# Patient Record
Sex: Female | Born: 1960 | Race: Asian | Hispanic: No | Marital: Married | Smoking: Never smoker
Health system: Southern US, Community
[De-identification: ages and names within clinical notes are randomized; demographics above are authoritative.]

## PROBLEM LIST (undated history)

## (undated) DIAGNOSIS — R519 Headache, unspecified: Secondary | ICD-10-CM

## (undated) DIAGNOSIS — F32A Depression, unspecified: Secondary | ICD-10-CM

## (undated) DIAGNOSIS — R51 Headache: Secondary | ICD-10-CM

## (undated) DIAGNOSIS — F329 Major depressive disorder, single episode, unspecified: Secondary | ICD-10-CM

## (undated) DIAGNOSIS — F419 Anxiety disorder, unspecified: Secondary | ICD-10-CM

## (undated) DIAGNOSIS — I10 Essential (primary) hypertension: Secondary | ICD-10-CM

## (undated) HISTORY — DX: Depression, unspecified: F32.A

## (undated) HISTORY — PX: TUMOR REMOVAL: SHX12

## (undated) HISTORY — DX: Major depressive disorder, single episode, unspecified: F32.9

## (undated) HISTORY — PX: LASIK: SHX215

## (undated) HISTORY — DX: Headache, unspecified: R51.9

## (undated) HISTORY — DX: Headache: R51

## (undated) HISTORY — DX: Anxiety disorder, unspecified: F41.9

## (undated) HISTORY — PX: HAND SURGERY: SHX662

## (undated) HISTORY — PX: WISDOM TOOTH EXTRACTION: SHX21

---

## 2010-02-17 ENCOUNTER — Ambulatory Visit: Payer: Self-pay | Admitting: Family Medicine

## 2010-02-17 ENCOUNTER — Inpatient Hospital Stay (HOSPITAL_COMMUNITY): Admission: EM | Admit: 2010-02-17 | Discharge: 2010-02-18 | Payer: Self-pay | Admitting: Family Medicine

## 2010-02-18 ENCOUNTER — Encounter: Payer: Self-pay | Admitting: Family Medicine

## 2010-02-27 ENCOUNTER — Telehealth: Payer: Self-pay | Admitting: Family Medicine

## 2011-01-28 NOTE — Progress Notes (Signed)
  Phone Note Outgoing Call   Call placed by: Aloys Hupfer MD,  February 27, 2010 5:46 PM Summary of Call: Pt unable to keep hosp f/u appt today because she did not have transportation.  Pt not taking Celexa because it caused vomiting and diarrhea. Pt currently in [place].  She was given incorrect number for [facility] so she could not call to cancel appt.  She was given number as [phone].  Initial call taken by: Rayhana Slider MD,  February 27, 2010 5:52 PM

## 2011-01-28 NOTE — Miscellaneous (Signed)
Summary: Rx for Celexa  Clinical Lists Changes  Medications: Added new medication of CELEXA 20 MG TABS (CITALOPRAM HYDROBROMIDE) 1 tab by mouth every morning for depression - Signed Rx of CELEXA 20 MG TABS (CITALOPRAM HYDROBROMIDE) 1 tab by mouth every morning for depression;  #30 x 3;  Signed;  Entered by: Angeline Slim MD;  Authorized by: Angeline Slim MD;  Method used: Electronically to [facility] # [phone]*, [address] [place], [place], [place]  [address], Ph: [phone], Fax: [phone]    Prescriptions: CELEXA 20 MG TABS (CITALOPRAM HYDROBROMIDE) 1 tab by mouth every morning for depression  #30 x 3   Entered and Authorized by:   Angeline Slim MD   Signed by:   Angeline Slim MD on 02/18/2010   Method used:   Electronically to        [facility] # [phone]* (retail)       [address] [place]  [address]       Ph: [phone]       Fax: [phone]   RxID:   [id number]

## 2011-03-21 LAB — BASIC METABOLIC PANEL
BUN: 5 mg/dL — ABNORMAL LOW (ref 6–23)
CO2: 26 mEq/L (ref 19–32)
Chloride: 102 mEq/L (ref 96–112)
Chloride: 104 mEq/L (ref 96–112)
GFR calc Af Amer: >60 mL/min (ref >60)
GFR calc non Af Amer: >60 mL/min (ref >60)
Glucose, Bld: 119 mg/dL — ABNORMAL HIGH (ref 70–99)
Potassium: 3.6 mEq/L (ref 3.5–5.1)
Potassium: 3.9 mEq/L (ref 3.5–5.1)
Sodium: 133 mEq/L — ABNORMAL LOW (ref 135–145)

## 2011-03-21 LAB — CBC
HCT: 38 % (ref 36.0–46.0)
MCHC: 34.5 g/dL (ref 30.0–36.0)
MCV: 93.4 fL (ref 78.0–100.0)
RBC: 4.07 MIL/uL (ref 3.87–5.11)
WBC: 17.5 10*3/uL — ABNORMAL HIGH (ref 4.0–10.5)

## 2011-03-21 LAB — DIFFERENTIAL
Basophils Relative: 0 % (ref 0–1)
Eosinophils Absolute: 0.2 10*3/uL (ref 0.0–0.7)
Eosinophils Relative: 1 % (ref 0–5)
Lymphs Abs: 2.2 10*3/uL (ref 0.7–4.0)
Monocytes Relative: 6 % (ref 3–12)

## 2014-01-31 ENCOUNTER — Encounter: Payer: Self-pay | Admitting: Emergency Medicine

## 2014-01-31 ENCOUNTER — Ambulatory Visit: Payer: No Typology Code available for payment source | Admitting: Emergency Medicine

## 2014-01-31 VITALS — BP 98/68 | HR 88 | Temp 98.0°F | Resp 16 | Ht 67.0 in | Wt 114.0 lb

## 2014-01-31 DIAGNOSIS — N390 Urinary tract infection, site not specified: Secondary | ICD-10-CM

## 2014-01-31 DIAGNOSIS — R300 Dysuria: Secondary | ICD-10-CM

## 2014-01-31 LAB — POCT URINALYSIS DIPSTICK
BILIRUBIN UA: NEGATIVE
GLUCOSE UA: NEGATIVE
Ketones, UA: NEGATIVE
Nitrite, UA: NEGATIVE
Protein, UA: NEGATIVE
RBC UA: NEGATIVE
SPEC GRAV UA: 1.01
Urobilinogen, UA: 0.2
pH, UA: 7

## 2014-01-31 LAB — POCT UA - MICROSCOPIC ONLY
Casts, Ur, LPF, POC: NEGATIVE
Crystals, Ur, HPF, POC: NEGATIVE
MUCUS UA: NEGATIVE
Yeast, UA: NEGATIVE

## 2014-01-31 MED ORDER — NITROFURANTOIN MONOHYD MACRO 100 MG PO CAPS: 100.0000 mg | ORAL_CAPSULE | Freq: Two times a day (BID) | ORAL | Status: DC

## 2014-01-31 NOTE — Patient Instructions (Signed)
Urinary Tract Infection  Urinary tract infections (UTIs) can develop anywhere along your urinary tract. Your urinary tract is your body's drainage system for removing wastes and extra water. Your urinary tract includes two kidneys, two ureters, a bladder, and a urethra. Your kidneys are a pair of bean-shaped organs. Each kidney is about the size of your fist. They are located below your ribs, one on each side of your spine.  CAUSES  Infections are caused by microbes, which are microscopic organisms, including fungi, viruses, and bacteria. These organisms are so small that they can only be seen through a microscope. Bacteria are the microbes that most commonly cause UTIs.  SYMPTOMS   Symptoms of UTIs may vary by age and gender of the patient and by the location of the infection. Symptoms in young women typically include a frequent and intense urge to urinate and a painful, burning feeling in the bladder or urethra during urination. Older women and men are more likely to be tired, shaky, and weak and have muscle aches and abdominal pain. A fever may mean the infection is in your kidneys. Other symptoms of a kidney infection include pain in your back or sides below the ribs, nausea, and vomiting.  DIAGNOSIS  To diagnose a UTI, your caregiver will ask you about your symptoms. Your caregiver also will ask to provide a urine sample. The urine sample will be tested for bacteria and white blood cells. White blood cells are made by your body to help fight infection.  TREATMENT   Typically, UTIs can be treated with medication. Because most UTIs are caused by a bacterial infection, they usually can be treated with the use of antibiotics. The choice of antibiotic and length of treatment depend on your symptoms and the type of bacteria causing your infection.  HOME CARE INSTRUCTIONS   If you were prescribed antibiotics, take them exactly as your caregiver instructs you. Finish the medication even if you feel better after you  have only taken some of the medication.   Drink enough water and fluids to keep your urine clear or pale yellow.   Avoid caffeine, tea, and carbonated beverages. They tend to irritate your bladder.   Empty your bladder often. Avoid holding urine for long periods of time.   Empty your bladder before and after sexual intercourse.   After a bowel movement, women should cleanse from front to back. Use each tissue only once.  SEEK MEDICAL CARE IF:    You have back pain.   You develop a fever.   Your symptoms do not begin to resolve within 3 days.  SEEK IMMEDIATE MEDICAL CARE IF:    You have severe back pain or lower abdominal pain.   You develop chills.   You have nausea or vomiting.   You have continued burning or discomfort with urination.  MAKE SURE YOU:    Understand these instructions.   Will watch your condition.   Will get help right away if you are not doing well or get worse.  Document Released: 09/24/2005 Document Revised: 06/15/2012 Document Reviewed: 01/23/2012  Exit[employer] Patient Information 2014 [employer].

## 2014-01-31 NOTE — Progress Notes (Signed)
   Subjective:    Patient ID: Glenda Pham, female    DOB: 11-Feb-1961, 53 y.o.   MRN: [id number]  HPI 53 yo female with dysuria, hesitancy, frequency and urgency for past week.  Gradually worsening.  History of UTI with similar symptoms in past, unsure of medications given to help alleviate symptoms. No radiation of pain.   PPMH:  Negative for diabetes or hypertension  SH:  Nonsmoker, no alcohol  FH:  noncontributory  Review of Systems  Constitutional: Negative for fever and chills.  Gastrointestinal: Negative for nausea, vomiting, abdominal pain, diarrhea and constipation.  Genitourinary: Positive for urgency and frequency. Negative for hematuria, flank pain and decreased urine volume.  Musculoskeletal: Negative for back pain.       Objective:   Physical Exam Blood pressure 98/68, pulse 88, temperature 98 F (36.7 C), temperature source Oral, resp. rate 16, height 5\' 7"  (1.702 m), weight 114 lb (51.71 kg), SpO2 98.00%. Body mass index is 17.85 kg/(m^2). Well-developed, well nourished female  who is awake, alert and oriented, in NAD. HEENT: [place]/AT, PERRL, EOMI.  Sclera and conjunctiva are clear.  Neck: supple, non-tender, no lymphadenopathy, thyromegaly. Heart: RRR, no murmur Lungs: normal effort, CTA Abdomen: normo-active bowel sounds, supple, non-tender, no mass or organomegaly. Back:  No CVA tenderness Extremities: no cyanosis, clubbing or edema. Skin: warm and dry without rash. Psychologic: good mood and appropriate affect, normal speech and behavior.     Assessment & Plan:   Results for orders placed in visit on 01/31/14  POCT UA - MICROSCOPIC ONLY      Result Value Range   WBC, Ur, HPF, POC 2-6     RBC, urine, microscopic 0-3     Bacteria, U Microscopic 2+     Mucus, UA negative     Epithelial cells, urine per micros 1-2     Crystals, Ur, HPF, POC negative     Casts, Ur, LPF, POC negative     Yeast, UA negative    POCT URINALYSIS DIPSTICK      Result Value  Range   Color, UA yallow     Clarity, UA clear     Glucose, UA negative     Bilirubin, UA negative     Ketones, UA negative     Spec Grav, UA 1.010     Blood, UA negative     pH, UA 7.0     Protein, UA negative     Urobilinogen, UA 0.2     Nitrite, UA negative     Leukocytes, UA small (1+)    Diagnosis UTI, will start on Macrobid x 5 days.

## 2014-04-25 ENCOUNTER — Ambulatory Visit (INDEPENDENT_AMBULATORY_CARE_PROVIDER_SITE_OTHER): Payer: No Typology Code available for payment source | Admitting: Emergency Medicine

## 2014-04-25 VITALS — BP 120/78 | HR 93 | Temp 97.5°F | Resp 20 | Ht <= 58 in | Wt 117.6 lb

## 2014-04-25 DIAGNOSIS — M130 Polyarthritis, unspecified: Secondary | ICD-10-CM

## 2014-04-25 LAB — POCT CBC
Granulocyte percent: 64.4 %G (ref 37–80)
HEMATOCRIT: 39.6 % (ref 37.7–47.9)
Hemoglobin: 12.7 g/dL (ref 12.2–16.2)
LYMPH, POC: 2.1 (ref 0.6–3.4)
MCH: 30.2 pg (ref 27–31.2)
MCHC: 32.1 g/dL (ref 31.8–35.4)
MCV: 94.2 fL (ref 80–97)
MID (cbc): 0.5 (ref 0–0.9)
MPV: 10.6 fL (ref 0–99.8)
POC GRANULOCYTE: 4.7 (ref 2–6.9)
POC LYMPH %: 29.3 % (ref 10–50)
POC MID %: 6.3 % (ref 0–12)
Platelet Count, POC: 230 10*3/uL (ref 142–424)
RBC: 4.2 M/uL (ref 4.04–5.48)
RDW, POC: 12.6 %
WBC: 7.3 10*3/uL (ref 4.6–10.2)

## 2014-04-25 LAB — COMPREHENSIVE METABOLIC PANEL
ALT: 12 U/L (ref 0–35)
AST: 12 U/L (ref 0–37)
Albumin: 4.1 g/dL (ref 3.5–5.2)
Alkaline Phosphatase: 51 U/L (ref 39–117)
BILIRUBIN TOTAL: 0.7 mg/dL (ref 0.2–1.2)
BUN: 11 mg/dL (ref 6–23)
CALCIUM: 9 mg/dL (ref 8.4–10.5)
CHLORIDE: 103 meq/L (ref 96–112)
CO2: 29 meq/L (ref 19–32)
CREATININE: 0.68 mg/dL (ref 0.50–1.10)
Glucose, Bld: 98 mg/dL (ref 70–99)
Potassium: 3.9 mEq/L (ref 3.5–5.3)
Sodium: 137 mEq/L (ref 135–145)
Total Protein: 6.8 g/dL (ref 6.0–8.3)

## 2014-04-25 LAB — RHEUMATOID FACTOR

## 2014-04-25 LAB — POCT SEDIMENTATION RATE: POCT SED RATE: 12 mm/h (ref 0–22)

## 2014-04-25 LAB — URIC ACID: URIC ACID, SERUM: 4.7 mg/dL (ref 2.4–7.0)

## 2014-04-25 LAB — C-REACTIVE PROTEIN

## 2014-04-25 MED ORDER — MELOXICAM 15 MG PO TABS: 15.0000 mg | ORAL_TABLET | Freq: Every day | ORAL | Status: DC

## 2014-04-25 NOTE — Progress Notes (Signed)
[facility] [address], [place] [address] [phone]  Date:  04/25/2014   Name:  Ladonne Sharples   DOB:  05/19/1961   MRN:  [id number]  PCP:  No PCP Per Patient    Chief Complaint: full body pain   History of Present Illness:  Mariaceleste Herrera is a 53 y.o. very pleasant female patient who presents with the following:  Has a month long duration of pain in joints that is transient and migratory.  Involves mainly the larger joints.  Not associated with tick exposure, fever, chills, jaundice.  No injury or overuse.  No improvement with over the counter medications or other home remedies.  Born in [place].  Denies other complaint or health concern today.   There are no active problems to display for this patient.   Past Medical History  Diagnosis Date  . Depression     History reviewed. No pertinent past surgical history.  History  Substance Use Topics  . Smoking status: Never Smoker   . Smokeless tobacco: Never Used  . Alcohol Use: No    History reviewed. No pertinent family history.  No Known Allergies  Medication list has been reviewed and updated.  Current Outpatient Prescriptions on File Prior to Visit  Medication Sig Dispense Refill  . nitrofurantoin, macrocrystal-monohydrate, (MACROBID) 100 MG capsule Take 1 capsule (100 mg total) by mouth 2 (two) times daily.  14 capsule  0   No current facility-administered medications on file prior to visit.    Review of Systems:  As per HPI, otherwise negative.    Physical Examination: Filed Vitals:   04/25/14 1517  BP: 120/78  Pulse: 93  Temp: 97.5 F (36.4 C)  Resp: 20   Filed Vitals:   04/25/14 1517  Height: 4' 8.5" (1.435 m)  Weight: 117 lb 9.6 oz (53.343 kg)   Body mass index is 25.9 kg/(m^2). Ideal Body Weight: Weight in (lb) to have BMI = 25: 113.3   GEN: WDWN, NAD, Non-toxic, Alert & Oriented x 3 HEENT: Atraumatic, Normocephalic.  Ears and Nose: No external deformity. EXTR:  No clubbing/cyanosis/edema NEURO: Normal gait.  PSYCH: Normally interactive. Conversant. Not depressed or anxious appearing.  Calm demeanor.  No rash or icterus No joint redness or swelling.    Assessment and Plan: Polyarthritis Labs mobic Follow up with Dr Marin Comment   Signed,  Ellison Carwin, MD

## 2014-04-26 LAB — ANA: Anti Nuclear Antibody(ANA): NEGATIVE

## 2014-05-03 NOTE — Progress Notes (Signed)
Attempted to call, spoke to someone in the home, they did not understand me and disconnected call/ unable to schedule

## 2014-08-13 ENCOUNTER — Ambulatory Visit (INDEPENDENT_AMBULATORY_CARE_PROVIDER_SITE_OTHER): Payer: No Typology Code available for payment source | Admitting: Family Medicine

## 2014-08-13 ENCOUNTER — Ambulatory Visit (INDEPENDENT_AMBULATORY_CARE_PROVIDER_SITE_OTHER): Payer: No Typology Code available for payment source

## 2014-08-13 VITALS — BP 102/72 | HR 97 | Temp 98.1°F | Resp 18 | Ht <= 58 in | Wt 115.8 lb

## 2014-08-13 DIAGNOSIS — M67431 Ganglion, right wrist: Secondary | ICD-10-CM

## 2014-08-13 DIAGNOSIS — M25539 Pain in unspecified wrist: Secondary | ICD-10-CM

## 2014-08-13 DIAGNOSIS — M25531 Pain in right wrist: Secondary | ICD-10-CM

## 2014-08-13 DIAGNOSIS — M6740 Ganglion, unspecified site: Secondary | ICD-10-CM

## 2014-08-13 NOTE — Progress Notes (Signed)
   Subjective:    Patient ID: Glenda Pham, female    DOB: Jan 27, 1961, 53 y.o.   MRN: [id number] This chart was scribed for Glenda Haber, MD by Steva Colder, ED Scribe. The patient was seen in room 12 at 1:19 PM.   Chief Complaint  Patient presents with  . 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 female who presents to the Emergency Department complaining of left hand pain. 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 She states that she was picking up a baby and thinks that is what caused it.  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 She voices concerns for why there is wrist pain as well. She denies any other associated symptoms. 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   There are no active problems to display for this patient.  Past Medical History  Diagnosis Date  . Depression    History reviewed. No pertinent past surgical history. No Known Allergies Prior to Admission medications   Not on File     Review of Systems  Musculoskeletal: Positive for arthralgias (left hand pain).       Objective:   Physical Exam  Nursing note and vitals reviewed. Constitutional: She is oriented to person, place, and time. She appears well-developed and well-nourished. No distress.  HENT:  Head: Normocephalic and atraumatic.  Eyes: EOM are normal.  Neck: Neck supple. No tracheal deviation present.  Cardiovascular: Normal rate.   Pulmonary/Chest: Effort normal. No respiratory distress.  Musculoskeletal: Normal range of motion.  Neurological: She is alert and oriented to person, place, and time.  Skin: Skin is warm and dry.  Mid-dorsal left wrist hard nodule subcutaneous location.   Psychiatric: She has a  normal mood and affect. Her behavior is normal.   [facility] reading (PRIMARY) by  Dr. Joseph Art:  Normal left wrist.      BP 102/72  Pulse 97  Temp(Src) 98.1 F (36.7 C) (Oral)  Resp 18  Ht 4\' 9"  (1.448 m)  Wt 115 lb 12.8 oz (52.527 kg)  BMI 25.05 kg/m2  SpO2 97%  LMP 06/13/2014  Assessment & Plan:  DIAGNOSTIC STUDIES: Oxygen Saturation is 97% on room air, normal by my interpretation.    COORDINATION OF CARE: 1:25 PM-Discussed treatment plan which includes left wrist X-ray, referral to a hand specialist and a splint with pt at bedside and pt agreed to plan.   1. Wrist pain, acute, right     I personally performed the services described in this documentation, which was scribed in my presence. The recorded information has been reviewed and is accurate.  Wrist pain, acute, right - Plan: DG Wrist Complete Left, Ambulatory referral to Orthopedic Surgery  Ganglion cyst of wrist, right - Plan: Ambulatory referral to Orthopedic Surgery Wrist splint  Signed, Glenda Haber, MD

## 2014-08-13 NOTE — Patient Instructions (Signed)
[employer] H?ch (Ganglion Cyst) U nang h?ch l m?t kh?i u khng ph?i ung th?, ch?a ??y d?ch xu?t hi?n g?n kh?p ho?c gn. U nang h?ch pht tri?n bn ngoi kh?p ho?c l?p nim m?c gn. N th??ng pht tri?n nh?t l ? bn tay ho?c c? tay, nh?ng c?ng c th? pht tri?n ? vai, khu?u tay, hng, ??u g?i, c? chn ho?c bn chn. H?ch trn ho?c hnh b?u d?c c kch th??c b?ng h?t ??u H Lan ho?c l?n h?n qu? nho. T?ng c??ng ho?t ??ng c th? lm t?ng kch th??c c?a u nang ny v ch?t l?ng b?t ??u tch t? nhi?u h?n.  NGUYN NHN Khng bi?t chnh xc nguyn nhn khi?n cho m?t u nang h?ch pht tri?n. Tuy nhin, n c th? lin quan ??n:  Vim ho?c kch ?ng xung quanh kh?p.  Ch?n th??ng.  C? ??ng l?p ?i l?p l?i ho?c s? d?ng qu m?c.  Vim kh?p. TRI?U CH?NG M?t kh?i u th??ng xu?t hi?n ? bn tay ho?c c? tay, nh?ng c th? xu?t hi?n ? cc khu v?c khc c?a c? th?. Ni chung, kh?i u khng ?au v khng c cc tri?u ch?ng khc. Tuy nhin, ?i khi c th? c?m th?y ?au khi ho?t ??ng ho?c khi ?n vo kh?i u. Kh?i u th?m ch c th? nh?y c?m ?au khi ch?m vo. C?m gic ?au bu?t, ?au, t ho?c y?u c? c th? xu?t hi?n n?u u nang h?ch ? ln m?t dy th?n kinh. N?m tay c?a b?n c th? y?u v b?n c th? c? ??ng t h?n ? cc kh?p. CH?N ?ON U nang h?ch th??ng ???c ch?n ?on d?a vo khm th?c th?, ch  v? tr c?a u nang v b? ngoi c?a n. Chuyn gia ch?m [place] s?c kh?e s? s? vo kh?i u v c th? dng ?n chi?u sng d?c theo kh?i u. N?u n l h?ch, ?n th??ng chi?u xuyn qua n. Chuyn gia ch?m [place] s?c kh?e c th? yu c?u ch?p X-quang, siu m ho?c MRI ?? lo?i tr? cc b?nh khc. ?I?U TR? H?ch th??ng t? kh?i m khng c?n ?i?u tr?. N?u b? ?au ho?c c cc tri?u ch?ng khc, c th? c?n ?i?u tr?. Th??ng c?n ?i?u tr? n?u h?ch lm h?n ch? c? ??ng c?a b?n ho?c n?u h?ch b? nhi?m trng. L?a ch?n ?i?u tr? bao g?m:  ?eo n?p ho?c vng c? tay ho?c ngn tay.  Dng thu?c ch?ng vim.  Ht d?ch t? kh?i u b?ng kim.  Tim steroid vo kh?p.  Ph?u thu?t c?t b?  u nang h?ch v cu?ng c?a n ???c g?n vo kh?p ho?c gn. Tuy nhin, u nang h?ch c th? pht tri?n tr? l?i. H??NG D?N CH?M [place] T?I NH  Khng ?n vo h?ch, khng ch?c vo h?ch b?ng kim ho?c ??p vo n b?ng v?i v?t n?ng. B?n c th? ch kh?i u m?t cch nh? nhng v th??ng xuyn. ?i khi d?ch c th? ch?y ra t? u nang.  Ch? s? d?ng thu?c theo ch? d?n c?a chuyn gia ch?m [place] s?c kh?e.  ?eo vng ho?c n?p theo h??ng d?n c?a chuyn gia ch?m [place] s?c kh?e. HY ?I KHM N?U:  H?ch tr? nn l?n h?n ho?c ?au h?n.  B?n b? t?y ??, s?c ?? ho?c s?ng t?ng ln  C m? ch?y ra t? kh?i u.  B?n c c?m gic y?u ho?c t ? vng b? ?nh h??ng. ??M B?O B?N:  Hi?u cc h??ng d?n ny.  S? theo di tnh tr?ng c?a mnh.  S? yu c?u tr? gip ngay l?p t?c n?u b?n c?m th?y khng ?? ho?c tnh tr?ng tr?m tr?ng. Document Released: 09/24/2005 Document Revised: 08/17/2013 [facility] Patient Information 2015 [place]. This information is not intended to replace advice given to you by your health care provider. Make sure you discuss any questions you have with your health care provider.

## 2014-11-08 ENCOUNTER — Ambulatory Visit (INDEPENDENT_AMBULATORY_CARE_PROVIDER_SITE_OTHER): Payer: No Typology Code available for payment source | Admitting: Family Medicine

## 2014-11-08 VITALS — BP 114/80 | HR 65 | Temp 99.0°F | Resp 16 | Ht <= 58 in | Wt 111.0 lb

## 2014-11-08 DIAGNOSIS — J069 Acute upper respiratory infection, unspecified: Secondary | ICD-10-CM

## 2014-11-08 MED ORDER — IPRATROPIUM BROMIDE 0.03 % NA SOLN: 2.0000 | Freq: Two times a day (BID) | NASAL | Status: DC

## 2014-11-08 MED ORDER — BENZONATATE 100 MG PO CAPS: 100.0000 mg | ORAL_CAPSULE | Freq: Three times a day (TID) | ORAL | Status: DC | PRN

## 2014-11-08 NOTE — Progress Notes (Signed)
Subjective:    Patient ID: Glenda Pham, female    DOB: 1961-08-13, 53 y.o.   MRN: [id number]  HPI Patient presents with daughter as translator for cough and congestion for week. Says she feels hot, but has not checked temperature. Also has associated sinus and ear pressure. Cough is sometimes productive. Denies sick contacts, h/o asthma/allergies, or smoking. Has tried OTC cold meds and Afrin without much relief. Wants IV fluids for treatment as this is what she gets in her home country of [place] as relief because it makes her feel better. She is eating well and drinking fluids. Denies vomiting or diarrhea.   Review of Systems  Constitutional: Negative for fever (feels hot), chills, diaphoresis, activity change and appetite change.  HENT: Positive for congestion, ear pain (ictchy), rhinorrhea, sinus pressure and sore throat. Negative for ear discharge and sneezing.   Respiratory: Positive for cough (productive). Negative for chest tightness, shortness of breath and wheezing.   Cardiovascular: Negative for chest pain.  Gastrointestinal: Negative for nausea, vomiting, abdominal pain, diarrhea and constipation.  Musculoskeletal: Negative for myalgias, neck pain and neck stiffness.  Allergic/Immunologic: Negative for environmental allergies and food allergies.  Neurological: Negative for dizziness and headaches.       Objective:   Physical Exam  Constitutional: She is oriented to person, place, and time. She appears well-nourished. No distress.  Blood pressure 114/80, pulse 65, temperature 99 F (37.2 C), temperature source Oral, resp. rate 16, height 4\' 9"  (1.448 m), weight 111 lb (50.349 kg), last menstrual period 10/08/2014, SpO2 99 %.   HENT:  Head: Normocephalic and atraumatic.  Right Ear: Tympanic membrane, external ear and ear canal normal.  Left Ear: Tympanic membrane, external ear and ear canal normal.  Nose: Mucosal edema and rhinorrhea present. No nose lacerations. Right  sinus exhibits no maxillary sinus tenderness and no frontal sinus tenderness. Left sinus exhibits no maxillary sinus tenderness and no frontal sinus tenderness.  Mouth/Throat: Uvula is midline and oropharynx is clear and moist. No oropharyngeal exudate, posterior oropharyngeal edema or posterior oropharyngeal erythema.  Eyes: Conjunctivae and EOM are normal. Pupils are equal, round, and reactive to light. Right eye exhibits no discharge. Left eye exhibits no discharge. No scleral icterus.  Neck: Normal range of motion. Neck supple. No thyromegaly present.  Cardiovascular: Normal rate, regular rhythm and normal heart sounds.  Exam reveals no gallop and no friction rub.   No murmur heard. Pulmonary/Chest: Effort normal and breath sounds normal. She has no wheezes. She has no rales. She exhibits no tenderness.  Abdominal: Soft. Bowel sounds are normal. There is no tenderness.  Lymphadenopathy:    She has no cervical adenopathy.  Neurological: She is alert and oriented to person, place, and time.  Skin: Skin is warm and dry. No rash noted. She is not diaphoretic. No erythema. No pallor.        Assessment & Plan:  1. Acute upper respiratory infection IV fluids not appropriate at this time. Cautioned patient of risk of giving fluids when not necessary. Cautioned against use of Afrin for more than 3 days. - ipratropium (ATROVENT) 0.03 % nasal spray; Place 2 sprays into both nostrils 2 (two) times daily.  Dispense: 30 mL; Refill: 0 - benzonatate (TESSALON) 100 MG capsule; Take 1-2 capsules (100-200 mg total) by mouth 3 (three) times daily as needed for cough.  Dispense: 40 capsule; Refill: 0 - Gave mucinex samples.  - Get plenty of fluid and rest. - May take tylenol or ibuprofen for fever.  Alveta Heimlich PA-C  [facility] 11/08/2014 6:56 PM

## 2014-11-08 NOTE — Patient Instructions (Signed)
Upper Respiratory Infection, Adult An upper respiratory infection (URI) is also sometimes known as the common cold. The upper respiratory tract includes the nose, sinuses, throat, trachea, and bronchi. Bronchi are the airways leading to the lungs. Most people improve within 1 week, but symptoms can last up to 2 weeks. A residual cough may last even longer.  CAUSES Many different viruses can infect the tissues lining the upper respiratory tract. The tissues become irritated and inflamed and often become very moist. Mucus production is also common. A cold is contagious. You can easily spread the virus to others by oral contact. This includes kissing, sharing a glass, coughing, or sneezing. Touching your mouth or nose and then touching a surface, which is then touched by another person, can also spread the virus. SYMPTOMS  Symptoms typically develop 1 to 3 days after you come in contact with a cold virus. Symptoms vary from person to person. They may include:  Runny nose.  Sneezing.  Nasal congestion.  Sinus irritation.  Sore throat.  Loss of voice (laryngitis).  Cough.  Fatigue.  Muscle aches.  Loss of appetite.  Headache.  Low-grade fever. DIAGNOSIS  You might diagnose your own cold based on familiar symptoms, since most people get a cold 2 to 3 times a year. Your caregiver can confirm this based on your exam. Most importantly, your caregiver can check that your symptoms are not due to another disease such as strep throat, sinusitis, pneumonia, asthma, or epiglottitis. Blood tests, throat tests, and X-rays are not necessary to diagnose a common cold, but they may sometimes be helpful in excluding other more serious diseases. Your caregiver will decide if any further tests are required. RISKS AND COMPLICATIONS  You may be at risk for a more severe case of the common cold if you smoke cigarettes, have chronic heart disease (such as heart failure) or lung disease (such as asthma), or if  you have a weakened immune system. The very young and very old are also at risk for more serious infections. Bacterial sinusitis, middle ear infections, and bacterial pneumonia can complicate the common cold. The common cold can worsen asthma and chronic obstructive pulmonary disease (COPD). Sometimes, these complications can require emergency medical care and may be life-threatening. PREVENTION  The best way to protect against getting a cold is to practice good hygiene. Avoid oral or hand contact with people with cold symptoms. Wash your hands often if contact occurs. There is no clear evidence that vitamin C, vitamin E, echinacea, or exercise reduces the chance of developing a cold. However, it is always recommended to get plenty of rest and practice good nutrition. TREATMENT  Treatment is directed at relieving symptoms. There is no cure. Antibiotics are not effective, because the infection is caused by a virus, not by bacteria. Treatment may include:  Increased fluid intake. Sports drinks offer valuable electrolytes, sugars, and fluids.  Breathing heated mist or steam (vaporizer or shower).  Eating chicken soup or other clear broths, and maintaining good nutrition.  Getting plenty of rest.  Using gargles or lozenges for comfort.  Controlling fevers with ibuprofen or acetaminophen as directed by your caregiver.  Increasing usage of your inhaler if you have asthma. Zinc gel and zinc lozenges, taken in the first 24 hours of the common cold, can shorten the duration and lessen the severity of symptoms. Pain medicines may help with fever, muscle aches, and throat pain. A variety of non-prescription medicines are available to treat congestion and runny nose. Your caregiver   can make recommendations and may suggest nasal or lung inhalers for other symptoms.  HOME CARE INSTRUCTIONS   Only take over-the-counter or prescription medicines for pain, discomfort, or fever as directed by your  caregiver.  Use a warm mist humidifier or inhale steam from a shower to increase air moisture. This may keep secretions moist and make it easier to breathe.  Drink enough water and fluids to keep your urine clear or pale yellow.  Rest as needed.  Return to work when your temperature has returned to normal or as your caregiver advises. You may need to stay home longer to avoid infecting others. You can also use a face mask and careful hand washing to prevent spread of the virus. SEEK MEDICAL CARE IF:   After the first few days, you feel you are getting worse rather than better.  You need your caregiver's advice about medicines to control symptoms.  You develop chills, worsening shortness of breath, or brown or red sputum. These may be signs of pneumonia.  You develop yellow or brown nasal discharge or pain in the face, especially when you bend forward. These may be signs of sinusitis.  You develop a fever, swollen neck glands, pain with swallowing, or white areas in the back of your throat. These may be signs of strep throat. SEEK IMMEDIATE MEDICAL CARE IF:   You have a fever.  You develop severe or persistent headache, ear pain, sinus pain, or chest pain.  You develop wheezing, a prolonged cough, cough up blood, or have a change in your usual mucus (if you have chronic lung disease).  You develop sore muscles or a stiff neck. Document Released: 06/10/2001 Document Revised: 03/08/2012 Document Reviewed: 03/22/2014 Exit[employer] Patient Information 2015 [employer]. This information is not intended to replace advice given to you by your health care provider. Make sure you discuss any questions you have with your health care provider.  

## 2014-11-10 NOTE — Progress Notes (Signed)
Patient discussed with and examined with Ms. Ricki Miller. Agree with assessment and plan of care per her note. Offered CBC and CXR with her persistent cough, but these were declined. With predominant upper airway symptoms, suspect some of cough from PND.  SX care as discussed below. Clinically appears well hydrated, tolerating po fluids and normal UOP by history. Discussed that IV placement and IV fluids not clinically indicated at this time, but if any clinical worsening - RTC or ER. Understanding expressed with use of interpreter in room.

## 2015-08-26 ENCOUNTER — Ambulatory Visit (INDEPENDENT_AMBULATORY_CARE_PROVIDER_SITE_OTHER): Payer: 59 | Admitting: Emergency Medicine

## 2015-08-26 VITALS — BP 128/82 | HR 99 | Temp 99.1°F | Resp 18 | Ht <= 58 in | Wt 115.6 lb

## 2015-08-26 DIAGNOSIS — M791 Myalgia, unspecified site: Secondary | ICD-10-CM

## 2015-08-26 NOTE — Patient Instructions (Signed)
Menopause Menopause is the normal time of life when menstrual periods stop completely. Menopause is complete when you have missed 12 consecutive menstrual periods. It usually occurs between the ages of 48 years and 55 years. Very rarely does a woman develop menopause before the age of 40 years. At menopause, your ovaries stop producing the female hormones estrogen and progesterone. This can cause undesirable symptoms and also affect your health. Sometimes the symptoms may occur 4-5 years before the menopause begins. There is no relationship between menopause and:  Oral contraceptives.  Number of children you had.  Race.  The age your menstrual periods started (menarche). Heavy smokers and very thin women may develop menopause earlier in life. CAUSES  The ovaries stop producing the female hormones estrogen and progesterone.  Other causes include:  Surgery to remove both ovaries.  The ovaries stop functioning for no known reason.  Tumors of the pituitary gland in the brain.  Medical disease that affects the ovaries and hormone production.  Radiation treatment to the abdomen or pelvis.  Chemotherapy that affects the ovaries. SYMPTOMS   Hot flashes.  Night sweats.  Decrease in sex drive.  Vaginal dryness and thinning of the vagina causing painful intercourse.  Dryness of the skin and developing wrinkles.  Headaches.  Tiredness.  Irritability.  Memory problems.  Weight gain.  Bladder infections.  Hair growth of the face and chest.  Infertility. More serious symptoms include:  Loss of bone (osteoporosis) causing breaks (fractures).  Depression.  Hardening and narrowing of the arteries (atherosclerosis) causing heart attacks and strokes. DIAGNOSIS   When the menstrual periods have stopped for 12 straight months.  Physical exam.  Hormone studies of the blood. TREATMENT  There are many treatment choices and nearly as many questions about them. The  decisions to treat or not to treat menopausal changes is an individual choice made with your health care provider. Your health care provider can discuss the treatments with you. Together, you can decide which treatment will work best for you. Your treatment choices may include:   Hormone therapy (estrogen and progesterone).  Non-hormonal medicines.  Treating the individual symptoms with medicine (for example antidepressants for depression).  Herbal medicines that may help specific symptoms.  Counseling by a psychiatrist or psychologist.  Group therapy.  Lifestyle changes including:  Eating healthy.  Regular exercise.  Limiting caffeine and alcohol.  Stress management and meditation.  No treatment. HOME CARE INSTRUCTIONS   Take the medicine your health care provider gives you as directed.  Get plenty of sleep and rest.  Exercise regularly.  Eat a diet that contains calcium (good for the bones) and soy products (acts like estrogen hormone).  Avoid alcoholic beverages.  Do not smoke.  If you have hot flashes, dress in layers.  Take supplements, calcium, and vitamin D to strengthen bones.  You can use over-the-counter lubricants or moisturizers for vaginal dryness.  Group therapy is sometimes very helpful.  Acupuncture may be helpful in some cases. SEEK MEDICAL CARE IF:   You are not sure you are in menopause.  You are having menopausal symptoms and need advice and treatment.  You are still having menstrual periods after age 55 years.  You have pain with intercourse.  Menopause is complete (no menstrual period for 12 months) and you develop vaginal bleeding.  You need a referral to a specialist (gynecologist, psychiatrist, or psychologist) for treatment. SEEK IMMEDIATE MEDICAL CARE IF:   You have severe depression.  You have excessive vaginal bleeding.    You fell and think you have a broken bone.  You have pain when you urinate.  You develop leg or  chest pain.  You have a fast pounding heart beat (palpitations).  You have severe headaches.  You develop vision problems.  You feel a lump in your breast.  You have abdominal pain or severe indigestion. Document Released: 03/06/2004 Document Revised: 08/17/2013 Document Reviewed: 07/14/2013 [facility] Patient Information 2015 [place], [place]. This information is not intended to replace advice given to you by your health care provider. Make sure you discuss any questions you have with your health care provider. Black Cohosh, Cimicifuga racemosa oral dosage forms What is this medicine? BLACK COHOSH (blak KOH hosh) or Cimicifuga racemosa is a dietary supplement. It is being promoted to help support female health problems, like the symptoms of menopause (hot flashes). Black cohosh is also promoted to ease menstrual pain or pre-menstrual syndrome (PMS). The FDA does not recognize black cohosh as being safe or effective for any use at this time, and warns against its use in pregnancy. This supplement may be used for other purposes; ask your health care provider or pharmacist if you have questions. This medicine may be used for other purposes; ask your health care provider or pharmacist if you have questions. What should I tell my health care provider before I take this medicine? They need to know if you have any of these conditions: -cancer -endometriosis or uterine fibroids -high blood pressure -infertility -kidney disease -liver disease -menstrual changes or irregular periods -unusual vaginal or uterine bleeding -an unusual or allergic reaction to black cohosh, soybeans, tartrazine dye (yellow dye number 5), other medicines, foods, dyes, or preservatives -pregnant or trying to get pregnant -breast-feeding How should I use this medicine? Take this herb by mouth with a glass of water. Follow the directions on the package labeling, or talk to your health care professional. Do not use for  longer than 6 months without the advice of a health care professional. Do not use if you are pregnant or breast-feeding. Talk to your obstetrician-gynecologist or certified nurse-midwife. This herb is not for use in children under the age of 40 years. Overdosage: If you think you have taken too much of this medicine contact a poison control center or emergency room at once. NOTE: This medicine is only for you. Do not share this medicine with others. What if I miss a dose? If you miss a dose, take it as soon as you can. If it is almost time for your next dose, take only that dose. Do not take double or extra doses. What may interact with this medicine? -female hormones, like estrogens or progestins and birth control pills -fertility treatments -medicines for blood pressure -medicines for diabetes This list may not describe all possible interactions. Give your health care provider a list of all the medicines, herbs, non-prescription drugs, or dietary supplements you use. Also tell them if you smoke, drink alcohol, or use illegal drugs. Some items may interact with your medicine. What should I watch for while using this medicine? Since this herb is derived from a plant, allergic reactions are possible. Stop using this herb if you develop a rash. You may need to see your health care professional, or inform them that this occurred. Report any unusual side effects promptly. If you are taking this herb for menstrual or menopausal symptoms, visit your doctor or health care professional for regular checks on your progress. You should have a complete check-up every 6 months.  You will need a regular breast and pelvic exam while on this therapy. Follow the advice of your doctor or health care professional. If you have any reason to think you are pregnant, stop taking this herb at once and contact your doctor or health care professional. Herbal or dietary supplements are not regulated like medicines. Rigid  quality control standards are not required for dietary supplements. The purity and strength of these products can vary. The safety and effect of this dietary supplement for a certain disease or illness is not well known. This product is not intended to diagnose, treat, cure or prevent any disease. The Food and Drug Administration suggests the following to help consumers protect themselves: -Always read product labels and follow directions. -Natural does not mean a product is safe for humans to take. -Look for products that include USP after the ingredient name. This means that the manufacturer followed the standards of the [place] Pharmacopoeia. -Supplements made or sold by a nationally known food or drug company are more likely to be made under tight controls. You can write to the company for more information about how the product was made. What side effects may I notice from receiving this medicine? Side effects that you should report to your doctor or health care professional as soon as possible: -allergic reactions like skin rash, itching or hives, swelling of the face, lips, or tongue -difficulty breathing, shortness of breath, or wheezing -easy bruising -fast heartbeat, slow heartbeat, or palpitations -headache -high blood pressure -severe nausea or vomiting -unusually weak or tired Side effects that usually do not require medical attention (report to your doctor or health care professional if they continue or are bothersome): -heartburn -mild upset stomach This list may not describe all possible side effects. Call your doctor for medical advice about side effects. You may report side effects to FDA at 1-800-FDA-1088. Where should I keep my medicine? Keep out of the reach of children. Store at room temperature between 15 and 30 degrees C (59 and 86 degrees C). Throw away any unused herb after the expiration date. NOTE: This sheet is a summary. It may not cover all possible information. If you  have questions about this medicine, talk to your doctor, pharmacist, or health care provider.  2015, Elsevier/Gold Standard. (2008-03-16 13:29:09)

## 2015-08-26 NOTE — Progress Notes (Signed)
This chart was scribed for Arlyss Queen, MD by Leandra Kern, Medical Scribe. This patient was seen in Room 8 and the patient's care was started at 4:00 PM.   Chief Complaint:  Chief Complaint  Patient presents with  . Generalized Body Aches    Whole body hurts, especially both of her arms. Pt. states she can't pick up anything heavy.     HPI: Glenda Pham is a 54 y.o. female who reports to [facility] today with her daughter who acts as an interpreter  complaining of generalized body aches.  Pt reports that her pain is more sever in her arms. She indicates that yesterday she had a very severe episode where she almost felt paralyzed on her arms. Pt does take a medication for the pain, however she does no recall the name of the medicine. Pt denies chest pain, sore throat, or trouble breathing. 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 Pt's daughter notes that the pt is usually anxious about many things, and she suffers symptoms of depression due to home sickness and family issues. Pt also reports that she last had her menstrual period few months ago, and she does suffer of hot flashes, and dizziness.   Pt works as a Public librarian.    Past Medical History  Diagnosis Date  . Depression    History reviewed. No pertinent past surgical history. Social History   Social History  . Marital Status: Married    Spouse Name: N/A  . Number of Children: N/A  . Years of Education: N/A   Social History Main Topics  . Smoking status: Never Smoker   . Smokeless tobacco: Never Used  . Alcohol Use: No  . Drug Use: No  . Sexual Activity: Not Asked   Other Topics Concern  . None   Social History Narrative   History reviewed. No pertinent family history. No Known Allergies Prior to Admission medications   Medication Sig Start Date End Date Taking? Authorizing Provider  benzonatate (TESSALON) 100 MG capsule Take 1-2 capsules (100-200  mg total) by mouth 3 (three) times daily as needed for cough. Patient not taking: Reported on 08/26/2015 11/08/14   Tishira R Brewington, PA-C  ipratropium (ATROVENT) 0.03 % nasal spray Place 2 sprays into both nostrils 2 (two) times daily. Patient not taking: Reported on 08/26/2015 11/08/14   Tishira R Brewington, PA-C     ROS: The patient has myalgia, anxiety, dysphoric mood, sore throat, dizziness.   Pt denies chest pain, trouble breathing.   All other systems have been reviewed and were otherwise negative with the exception of those mentioned in the HPI and as above.    PHYSICAL EXAM: Filed Vitals:   08/26/15 1556  BP: 128/82  Pulse: 99  Temp: 99.1 F (37.3 C)  Resp: 18   Body mass index is 25.01 kg/(m^2).   General: Very flat affect and appears depressed. Alert, no acute distress HEENT:  Normocephalic, atraumatic, oropharynx patent. Eye: Juliette Mangle [facility] Cardiovascular:  Regular rate and rhythm, no rubs murmurs or gallops.  No Carotid bruits, radial pulse intact. No pedal edema.  Respiratory: Clear to auscultation bilaterally.  No wheezes, rales, or rhonchi.  No cyanosis, no use of accessory musculature Abdominal: No organomegaly, abdomen is soft and non-tender, positive bowel sounds.  No masses. Musculoskeletal: Gait intact. No edema, tenderness Skin: No rashes. Neurologic: Facial musculature symmetric. Psychiatric: Patient acts appropriately throughout our interaction. Lymphatic: No  cervical or submandibular lymphadenopathy Genitourinary/Anorectal: No acute findings    LABS:    EKG/XRAY:   Primary read interpreted by Dr. Everlene Farrier at [facility]. EKG normal sinus rhythm no acute changes.   ASSESSMENT/PLAN: Patient refused to have her blood work drawn. She will take extra strength Tylenol twice a day. I gave her information about black cohosh. I do suspect some of her symptoms are menopausal. She refused as stated to have any blood work done. There is a lot of stress in the home  and her dealing with certain family members.   Gross sideeffects, risk and benefits, and alternatives of medications d/w patient. Patient is aware that all medications have potential sideeffects and we are unable to predict every sideeffect or drug-drug interaction that may occur.  Arlyss Queen MD 08/26/2015 4:00 PM

## 2016-01-14 ENCOUNTER — Ambulatory Visit: Payer: BLUE CROSS/BLUE SHIELD

## 2016-02-13 ENCOUNTER — Ambulatory Visit (INDEPENDENT_AMBULATORY_CARE_PROVIDER_SITE_OTHER): Payer: BLUE CROSS/BLUE SHIELD | Admitting: Family Medicine

## 2016-02-13 VITALS — BP 153/86 | HR 130 | Temp 103.0°F | Resp 16 | Ht <= 58 in | Wt 118.6 lb

## 2016-02-13 DIAGNOSIS — R350 Frequency of micturition: Secondary | ICD-10-CM | POA: Diagnosis not present

## 2016-02-13 DIAGNOSIS — R509 Fever, unspecified: Secondary | ICD-10-CM

## 2016-02-13 LAB — POCT URINALYSIS DIP (MANUAL ENTRY)
Bilirubin, UA: NEGATIVE
Glucose, UA: NEGATIVE
Ketones, POC UA: NEGATIVE
Leukocytes, UA: NEGATIVE
Nitrite, UA: NEGATIVE
Protein Ur, POC: NEGATIVE
Spec Grav, UA: <=1.005
Urobilinogen, UA: 0.2
pH, UA: 5.5

## 2016-02-13 LAB — POC MICROSCOPIC URINALYSIS (UMFC): Mucus: ABSENT

## 2016-02-13 LAB — POCT INFLUENZA A/B
Influenza A, POC: POSITIVE — AB
Influenza B, POC: NEGATIVE

## 2016-02-13 MED ORDER — OSELTAMIVIR PHOSPHATE 75 MG PO CAPS: 75.0000 mg | ORAL_CAPSULE | Freq: Two times a day (BID) | ORAL | Status: DC

## 2016-02-13 NOTE — Patient Instructions (Signed)
Cm, Ng??i l?n (Influenza, Adult) Cm ("b?nh cm") l tnh tr?ng nhi?m trng ???ng h h?p do vi rt. Cm x?y ra th??ng xuyn h?n vo nh?ng thng ma ?ng v m?i ng??i dnh nhi?u th?i gian ti?p xc g?n g?i v?i nhau h?n. Cm c th? lm cho qu v? c?m th?y r?t m?t m?i. Cm d? dng ly t? ng??i sang ng??i (d? ly). NGUYN NHN  Cm do m?t lo?i vi rt lm nhi?m trng ???ng h h?p gy ra. Qu v? c th? b? nhi?m vi rt do ht ph?i gi?t n??c b?n ra khi ng??i b? nhi?m b?nh ho ho?c h?t h?i. Qu v? c?ng c th? b? nhi?m vi rt do ch?m vo nh?ng v?t ? b? nhi?m vi rt g?n ?y, sau ? ch?m vo mi?ng, m?i ho?c m?t mnh. NGUY C? V BI?N CH?NG Qu v? c th? c nguy c? b? m?t tr??ng h?p cm n?ng h?n n?u qu v? ht thu?c l, b? ti?u ???ng, b? b?nh tim m?n tnh (nh? suy tim), ho?c b?nh ph?i (nh? hen suy?n), ho?c n?u qu v? c h? mi?n d?ch suy y?u. Ng??i cao tu?i v ph? n? mang thai c?ng c nguy c? b? nhi?m trng nghim tr?ng h?n. V?n ?? th??ng g?p nh?t c?a b?nh cm l nhi?m trng ph?i (vim ph?i). ?i khi, v?n ?? ny c th? c?n ph?i ???c ch?m [place] ?i?u tr? c?p c?u v c th? ?e d?a tnh m?ng. D?U HI?U V TRI?U CH?NG  Cc tri?u ch?ng th??ng ko di 4 ??n 10 ngy v c th? bao g?m:  S?t.  ?n l?nh.  ?au ??u, ?au nh?c c? th? v ?au nh?c c? b?p.  ?au h?ng.  C?m gic kh ch?u ? ng?c v ho.  ?n khng ngon.  Y?u ho?c c?m gic m?t m?i.  Chng m?t.  Bu?n nn ho?c nn m?a. CH?N ?ON  Ch?n ?on cm th??ng ???c ??a ra d?a vo b?nh s? v khm th?c th?. Xt nghi?m t?m bng ngoy m?i ho?c ngoy h?ng c th? ???c th?c hi?n ?? xc ??nh ch?n ?on. ?I?U TR?  Trong tr??ng h?p nh?, b?nh cm s? t? kh?i. ?i?u tr? nh?m lm gi?m tri?u ch?ng. ??i v?i nh?ng tr??ng h?p n?ng h?n, chuyn gia ch?m [place] s?c kh?e c th? k ??n thu?c khng vi rt ?? nhanh kh?i b?nh. Thu?c khng sinh khng hi?u qu? v nhi?m trng do vi rt gy ra, ch? khng ph?i do vi khu?n. H??NG D?N CH?M [place] T?I NH  Ch? s? d?ng thu?c theo ch? d?n c?a chuyn gia ch?m [place]  s?c kh?e.  S? d?ng d?ng c? lm ?m khng kh t?o s??ng m mt ?? gip d? th? h?n.  Ngh? ng?i th?t nhi?u cho ??n khi nhi?t ?? c?a qu v? tr? l?i bnh th??ng. ?i?u ny th??ng m?t 3 ??n 4 ngy.  U?ng ?? n??c ?? gi? cho n??c ti?u trong ho?c vng nh?t.  Che mi?ng v m?i khi ho ho?c h?t h?i, ??ng th?i r?a tay k? ?? trnh ly lan vi rt.  Ngh? lm ho?c ngh? h?c cho ??n sau khi h?t s?t t nh?t 1 ngy. PHNG NG?A  Tim phng cm hng n?m (tim phng cm) l cch t?t nh?t ?? trnh b? cm. Tim phng ccm hng n?m hi?n nay th??ng xuyn ???c khuy?n ngh? cho t?t c? ng??i l?n ? [place] ?I KHM N?U:  Qu v? b? ?au ng?c, ho n?ng thm, ho?c ho ra nhi?u d?ch nh?y h?n.  Qu v? b? bu?n nn, nn m?a ho?c tiu ch?y.  Qu v? b? s?t tr? l?i ho?c s?t cao h?n. NGAY L?P T?C ?I KHM N?U:  Qu v? th? kh kh?n, kh th?, ho?c da hay mng tay mng chn qu v? tr? nn xanh.  Qu v? b? ?au c? ho?c c?ng c? d? d?i.  Qu vi ??t nhin b? ?au ??u, ho?c ?au ? m?t ho?c tai.  Qu vi b? bu?n nn ho?c nn m?a khng th? ki?m sot ???c. ??M B?O QU V?:   Hi?u r cc h??ng d?n ny.  S? theo di tnh tr?ng c?a mnh.  S? yu c?u tr? gip ngay l?p t?c n?u qu v? c?m th?y khng kh?e ho?c th?y tr?m tr?ng h?n.   Thng tin ny khng nh?m m?c ?ch thay th? cho l?i khuyn m chuyn gia ch?m [place] s?c kh?e ni v?i qu v?. Hy b?o ??m qu v? ph?i th?o lu?n b?t k? v?n ?? g m qu v? c v?i chuyn gia ch?m [place] s?c kh?e c?a qu v?.   Document Released: 12/15/2005 Document Revised: 01/05/2015 Elsevier Interactive Patient Education [employer].

## 2016-02-13 NOTE — Progress Notes (Signed)
55 yo [place] woman with 2 weeks of malaise, fever x 2 days with cough.  Also c/o difficulty with urination with poor urine output.  I used [facility] interpreter for the interview.   Husband was here last night and teste positive for flu  Cough is non productive  Has been treated by doctor for urination 2 years ago.  Objective: BP 153/86 mmHg  Pulse 130  Temp(Src) 103 F (39.4 C) (Oral)  Resp 16  Ht 4\' 9"  (1.448 m)  Wt 118 lb 9.6 oz (53.797 kg)  BMI 25.66 kg/m2  SpO2 95% HEENT:   Chest:   Heart:  Ext:  Ganglion cyst left dorsal wrist.   Results for orders placed or performed in visit on 02/13/16  POCT urinalysis dipstick  Result Value Ref Range   Color, UA yellow yellow   Clarity, UA clear clear   Glucose, UA negative negative   Bilirubin, UA negative negative   Ketones, POC UA negative negative   Spec Grav, UA <=1.005    Blood, UA trace-intact (A) negative   pH, UA 5.5    Protein Ur, POC negative negative   Urobilinogen, UA 0.2    Nitrite, UA Negative Negative   Leukocytes, UA Negative Negative  POCT Microscopic Urinalysis ([facility])  Result Value Ref Range   WBC,UR,HPF,POC None None WBC/hpf   RBC,UR,HPF,POC None None RBC/hpf   Bacteria Few (A) None, Too numerous to count   Mucus Absent Absent   Epithelial Cells, UR Per Microscopy Few (A) None, Too numerous to count cells/hpf  POCT Influenza A/B  Result Value Ref Range   Influenza A, POC Positive (A) Negative   Influenza B, POC Negative Negative   This chart was scribed in my presence and reviewed by me personally.    ICD-9-CM ICD-10-CM   1. Fever and chills 780.60 R50.9 POCT urinalysis dipstick     POCT Microscopic Urinalysis ([facility])     POCT Influenza A/B     oseltamivir (TAMIFLU) 75 MG capsule  2. Frequency of urination 788.41 R35.0 POCT urinalysis dipstick     POCT Microscopic Urinalysis ([facility])     oseltamivir (TAMIFLU) 75 MG capsule   Told to push fluids and take tylenol or ibuprofen for  fever.  Signed, Robyn Haber, MD

## 2016-11-04 ENCOUNTER — Ambulatory Visit (INDEPENDENT_AMBULATORY_CARE_PROVIDER_SITE_OTHER): Payer: BLUE CROSS/BLUE SHIELD | Admitting: Family Medicine

## 2016-11-04 ENCOUNTER — Emergency Department (HOSPITAL_COMMUNITY)
Admission: EM | Admit: 2016-11-04 | Discharge: 2016-11-04 | Disposition: A | Discharge status: Home / Self Care | Payer: BLUE CROSS/BLUE SHIELD | Attending: Emergency Medicine | Admitting: Emergency Medicine

## 2016-11-04 ENCOUNTER — Encounter (HOSPITAL_COMMUNITY): Payer: Self-pay | Admitting: Emergency Medicine

## 2016-11-04 VITALS — BP 138/90 | HR 90 | Temp 98.2°F | Resp 16 | Ht <= 58 in | Wt 111.0 lb

## 2016-11-04 DIAGNOSIS — F329 Major depressive disorder, single episode, unspecified: Secondary | ICD-10-CM | POA: Diagnosis not present

## 2016-11-04 DIAGNOSIS — F439 Reaction to severe stress, unspecified: Secondary | ICD-10-CM

## 2016-11-04 DIAGNOSIS — R51 Headache: Secondary | ICD-10-CM

## 2016-11-04 DIAGNOSIS — F32A Depression, unspecified: Secondary | ICD-10-CM

## 2016-11-04 DIAGNOSIS — G43909 Migraine, unspecified, not intractable, without status migrainosus: Secondary | ICD-10-CM | POA: Diagnosis not present

## 2016-11-04 DIAGNOSIS — H538 Other visual disturbances: Secondary | ICD-10-CM | POA: Diagnosis not present

## 2016-11-04 DIAGNOSIS — R519 Headache, unspecified: Secondary | ICD-10-CM

## 2016-11-04 LAB — POCT SEDIMENTATION RATE: POCT SED RATE: 10 mm/hr (ref 0–22)

## 2016-11-04 MED ORDER — NAPROXEN 500 MG PO TABS: ORAL_TABLET | ORAL | 0 refills | Status: DC

## 2016-11-04 MED ORDER — DIPHENHYDRAMINE HCL 25 MG PO CAPS: ORAL_CAPSULE | ORAL | 0 refills | Status: DC

## 2016-11-04 MED ORDER — SODIUM CHLORIDE 0.9 % IV BOLUS (SEPSIS)
1000.0000 mL | Freq: Once | INTRAVENOUS | Status: AC
  Administered 2016-11-04: 1000 mL via INTRAVENOUS

## 2016-11-04 MED ORDER — KETOROLAC TROMETHAMINE 30 MG/ML IJ SOLN
30.0000 mg | Freq: Once | INTRAMUSCULAR | Status: AC
  Administered 2016-11-04: 30 mg via INTRAVENOUS
  Filled 2016-11-04: qty 1

## 2016-11-04 MED ORDER — METOCLOPRAMIDE HCL 5 MG/ML IJ SOLN
10.0000 mg | Freq: Once | INTRAMUSCULAR | Status: AC
  Administered 2016-11-04: 10 mg via INTRAVENOUS
  Filled 2016-11-04: qty 2

## 2016-11-04 MED ORDER — METOCLOPRAMIDE HCL 10 MG PO TABS: 10.0000 mg | ORAL_TABLET | Freq: Four times a day (QID) | ORAL | 0 refills | Status: DC | PRN

## 2016-11-04 MED ORDER — DIPHENHYDRAMINE HCL 50 MG/ML IJ SOLN
25.0000 mg | Freq: Once | INTRAMUSCULAR | Status: AC
  Administered 2016-11-04: 25 mg via INTRAVENOUS
  Filled 2016-11-04: qty 1

## 2016-11-04 MED ORDER — DEXAMETHASONE SODIUM PHOSPHATE 10 MG/ML IJ SOLN
10.0000 mg | Freq: Once | INTRAMUSCULAR | Status: AC
  Administered 2016-11-04: 10 mg via INTRAVENOUS
  Filled 2016-11-04: qty 1

## 2016-11-04 NOTE — Discharge Instructions (Signed)
Follow up with your primary care physician as needed.

## 2016-11-04 NOTE — Patient Instructions (Addendum)
Go to emergency room today for evaluation of your headache as this is the worst headache of your life. I will check blood tests in the office, and refer you to headache specialist in the next 1 week. You still should be seen in the emergency room today as you may need a CAT scan or other imaging with this headache and they can give you medicine in the emergency room.   For depression - return to discuss further, but also can call counselor: [facility]: [phone]      General Headache Without Cause A headache is pain or discomfort felt around the head or neck area. The specific cause of a headache may not be found. There are many causes and types of headaches. A few common ones are:  Tension headaches.  Migraine headaches.  Cluster headaches.  Chronic daily headaches. HOME CARE INSTRUCTIONS  Watch your condition for any changes. Take these steps to help with your condition: Managing Pain  Take over-the-counter and prescription medicines only as told by your health care provider.  Lie down in a dark, quiet room when you have a headache.  If directed, apply ice to the head and neck area:  Put ice in a plastic bag.  Place a towel between your skin and the bag.  Leave the ice on for 20 minutes, 2-3 times per day.  Use a heating pad or hot shower to apply heat to the head and neck area as told by your health care provider.  Keep lights dim if bright lights bother you or make your headaches worse. Eating and Drinking  Eat meals on a regular schedule.  Limit alcohol use.  Decrease the amount of caffeine you drink, or stop drinking caffeine. General Instructions  Keep all follow-up visits as told by your health care provider. This is important.  Keep a headache journal to help find out what may trigger your headaches. For example, write down:  What you eat and drink.  How much sleep you get.  Any change to your diet or medicines.  Try massage or  other relaxation techniques.  Limit stress.  Sit up straight, and do not tense your muscles.  Do not use tobacco products, including cigarettes, chewing tobacco, or e-cigarettes. If you need help quitting, ask your health care provider.  Exercise regularly as told by your health care provider.  Sleep on a regular schedule. Get 7-9 hours of sleep, or the amount recommended by your health care provider. SEEK MEDICAL CARE IF:   Your symptoms are not helped by medicine.  You have a headache that is different from the usual headache.  You have nausea or you vomit.  You have a fever. SEEK IMMEDIATE MEDICAL CARE IF:   Your headache becomes severe.  You have repeated vomiting.  You have a stiff neck.  You have a loss of vision.  You have problems with speech.  You have pain in the eye or ear.  You have muscular weakness or loss of muscle control.  You lose your balance or have trouble walking.  You feel faint or pass out.  You have confusion.   This information is not intended to replace advice given to you by your health care provider. Make sure you discuss any questions you have with your health care provider.   Document Released: 12/15/2005 Document Revised: 09/05/2015 Document Reviewed: 04/09/2015 Elsevier Interactive Patient Education [employer].    IF you received an x-ray today, you will receive an invoice  from [facility]. Please contact [facility] at [phone] with questions or concerns regarding your invoice.   IF you received labwork today, you will receive an invoice from [employer]. Please contact [facility] at [phone] with questions or concerns regarding your invoice.   Our billing staff will not be able to assist you with questions regarding bills from these companies.  You will be contacted with the lab results as soon as they are available. The fastest way to get your results is to activate  your My Chart account. Instructions are located on the last page of this paperwork. If you have not heard from [place] regarding the results in 2 weeks, please contact this office.

## 2016-11-04 NOTE — Progress Notes (Addendum)
By signing my name below, I, Mesha Guinyard, attest that this documentation has been prepared under the direction and in the presence of Merri Ray, MD.  Electronically Signed: Verlee Monte, Medical Scribe. 11/04/16. 8:15 AM.  Subjective:    Patient ID: Glenda Pham, female    DOB: [birth year], 55 y.o.   MRN: [id number]  HPI Chief Complaint  Patient presents with  . Headache    x almost 2 weeks    HPI Comments: Glenda Pham is a 55 y.o. female who presents to the [facility] complaining of intermittent bitemporal HAs for the past 3 years. Pt's native language is [place] so a interpreter was used; Engineer, [employer] #: [phone].   HA: She has had a daily bitemporal HAs for the past 3 years since moving to the [place] from [place], but the past week has been the worse HA she's ever had. Reports blurry vision, sleep loss, fatigue, tingling in her temples, and nausea. She feels like she needs to close her eyes with blurry vision, and her nausea occurs half of the time she has a HA. HAs are triggered by moving her head side to side. She takes OTC Panadol, that's from [place], for her HAs but it hasn't been working. Pt had a scan of her head while in [place] for her HAs and it showed sinusitis. Denies vomiting.  Pt went to [facility] years ago when she was sad in past and was feeling sick. She was told she had a infectious disease and sent a bill of $18000 and was told to take a medication although she was told she wasn't sick. Pt received the wrong number for follow-up and never followed-up due to cultural/language barrier.  Depression: Pt was taking care of her son's wedding last week and it wasn't successful. She has felt more depressed past week and she has had depression since moving to the [place] 10 years ago.   Depression screen [facility] 2/9 11/04/2016 02/13/2016 08/26/2015  Decreased Interest 0 0 0  Down, Depressed, Hopeless 0 0 0  PHQ - 2 Score 0 0 0    There are no  active problems to display for this patient.  Past Medical History:  Diagnosis Date  . Depression    No past surgical history on file. No Known Allergies Prior to Admission medications   Medication Sig Start Date End Date Taking? Authorizing Provider  ipratropium (ATROVENT) 0.03 % nasal spray Place 2 sprays into both nostrils 2 (two) times daily. Patient not taking: Reported on 11/04/2016 11/08/14   Nolene Bernheim, PA-C   Social History   Social History  . Marital status: Married    Spouse name: N/A  . Number of children: N/A  . Years of education: N/A   Occupational History  . Not on file.   Social History Main Topics  . Smoking status: Never Smoker  . Smokeless tobacco: Never Used  . Alcohol use No  . Drug use: No  . Sexual activity: Not on file   Other Topics Concern  . Not on file   Social History Narrative  . No narrative on file   Review of Systems  Constitutional: Positive for fatigue.  Eyes: Positive for visual disturbance.  Gastrointestinal: Positive for nausea. Negative for vomiting.  Neurological: Positive for headaches.  Psychiatric/Behavioral: Positive for dysphoric mood and sleep disturbance.   Objective:  Physical Exam  Constitutional: She appears well-developed and well-nourished. No distress.  HENT:  Head: Normocephalic and atraumatic.  Eyes: Conjunctivae and EOM are normal. Pupils are equal, round, and reactive to light. Right eye exhibits no nystagmus. Left eye exhibits no nystagmus.  Neck: Neck supple.  Cardiovascular: Normal rate.   Pulmonary/Chest: Effort normal.  Neurological: She is alert. She displays a negative Romberg sign.  No pronator drift Normal facial movements. Equal upper and lower strength bilaterally Non focal neuro exam   Skin: Skin is warm and dry.  Psychiatric: She has a normal mood and affect. Her behavior is normal.  Nursing note and vitals reviewed.  BP 138/90 (BP Location: Right Arm, Patient Position:  Sitting, Cuff Size: Normal)   Pulse 90   Temp 98.2 F (36.8 C) (Oral)   Resp 16   Ht 4\' 9"  (1.448 m)   Wt 111 lb (50.3 kg)   SpO2 99%   BMI 24.02 kg/m    Results for orders placed or performed in visit on 11/04/16  POCT SEDIMENTATION RATE  Result Value Ref Range   POCT SED RATE 10 0 - 22 mm/hr     Assessment & Plan:  With the interpreter service questions had been repeated a few times for clarification. We had face to face history with majority counseling for 45 mins. I also spoke to her daughter for a second translation to explain instructions on AVS. Understanding expressed.    Glenda Pham is a 55 y.o. female Worst headache of life - Plan: [place], Ambulatory referral to Neurology Chronic daily headache - Plan: Ambulatory referral to Neurology Blurry vision - Plan: POCT SEDIMENTATION RATE, Ambulatory referral to Neurology Temporal headache - Plan: POCT SEDIMENTATION RATE, Ambulatory referral to Neurology  - Chronic daily headache by history with possible multifactorial causes including situational stress/depression. However now for past 1 week more severe headache, and reports this being the worst headache of her life. Associated blurry vision and nausea. Nonfocal neuro exam, but based on worsening symptoms, recommended emergency room evaluation as possible neuroimaging would be needed, and headache treatment may be given then.   -Sedimentation rate checked for temporal arteritis, but less likely as bilateral.  Value noted as above, normal, doubt temporal arteritis.   Situational stress Depression, unspecified depression type  - Phone numbers given for counseling, but return to discuss depression further as medication may be needed. Sounds to be a long-standing issue without acute change other than increased stress from wedding that did not go well earlier this week.  Language barrier.  -interpreting services used with video interpreter, then patient's daughter  red after visit summary information. Understanding expressed   No orders of the defined types were placed in this encounter.  Patient Instructions   Go to emergency room today for evaluation of your headache as this is the worst headache of your life. I will check blood tests in the office, and refer you to headache specialist in the next 1 week. You still should be seen in the emergency room today as you may need a CAT scan or other imaging with this headache and they can give you medicine in the emergency room.   For depression - return to discuss further, but also can call counselor: [facility]: [phone]      General Headache Without Cause A headache is pain or discomfort felt around the head or neck area. The specific cause of a headache may not be found. There are many causes and types of headaches. A few common ones are:  Tension headaches.  Migraine headaches.  Cluster headaches.  Chronic daily  headaches. HOME CARE INSTRUCTIONS  Watch your condition for any changes. Take these steps to help with your condition: Managing Pain  Take over-the-counter and prescription medicines only as told by your health care provider.  Lie down in a dark, quiet room when you have a headache.  If directed, apply ice to the head and neck area:  Put ice in a plastic bag.  Place a towel between your skin and the bag.  Leave the ice on for 20 minutes, 2-3 times per day.  Use a heating pad or hot shower to apply heat to the head and neck area as told by your health care provider.  Keep lights dim if bright lights bother you or make your headaches worse. Eating and Drinking  Eat meals on a regular schedule.  Limit alcohol use.  Decrease the amount of caffeine you drink, or stop drinking caffeine. General Instructions  Keep all follow-up visits as told by your health care provider. This is important.  Keep a headache journal to help find out what may trigger  your headaches. For example, write down:  What you eat and drink.  How much sleep you get.  Any change to your diet or medicines.  Try massage or other relaxation techniques.  Limit stress.  Sit up straight, and do not tense your muscles.  Do not use tobacco products, including cigarettes, chewing tobacco, or e-cigarettes. If you need help quitting, ask your health care provider.  Exercise regularly as told by your health care provider.  Sleep on a regular schedule. Get 7-9 hours of sleep, or the amount recommended by your health care provider. SEEK MEDICAL CARE IF:   Your symptoms are not helped by medicine.  You have a headache that is different from the usual headache.  You have nausea or you vomit.  You have a fever. SEEK IMMEDIATE MEDICAL CARE IF:   Your headache becomes severe.  You have repeated vomiting.  You have a stiff neck.  You have a loss of vision.  You have problems with speech.  You have pain in the eye or ear.  You have muscular weakness or loss of muscle control.  You lose your balance or have trouble walking.  You feel faint or pass out.  You have confusion.   This information is not intended to replace advice given to you by your health care provider. Make sure you discuss any questions you have with your health care provider.   Document Released: 12/15/2005 Document Revised: 09/05/2015 Document Reviewed: 04/09/2015 Elsevier Interactive Patient Education 2016 [employer].    IF you received an x-ray today, you will receive an invoice from [facility]. Please contact [facility] at [phone] with questions or concerns regarding your invoice.   IF you received labwork today, you will receive an invoice from [employer]. Please contact [facility] at [phone] with questions or concerns regarding your invoice.   Our billing staff will not be able to assist you with questions regarding bills  from these companies.  You will be contacted with the lab results as soon as they are available. The fastest way to get your results is to activate your My Chart account. Instructions are located on the last page of this paperwork. If you have not heard from [place] regarding the results in 2 weeks, please contact this office.        I personally performed the services described in this documentation, which was scribed in my presence. The recorded information  has been reviewed and considered, and addended by me as needed.   Signed,   Merri Ray, MD [facility].  11/04/16 9:11 AM

## 2016-11-04 NOTE — ED Triage Notes (Signed)
Pt c/o headache over a week. C/o pain all over. Sometimes states she feels like throwing up but not right now. Pt ambulatory with steady gait. Pt in NAD. Grip strength equal, no neuro deficits.

## 2016-11-04 NOTE — ED Provider Notes (Addendum)
[facility] DEPT Provider Note   CSN: [id number] Arrival date & time: 11/04/16  1329     History   Chief Complaint Chief Complaint  Patient presents with  . Headache    HPI Glenda Pham is a 55 y.o. female. She presents for complaint of headache. She has had headaches for over 10 years. Previously had just taken simple over-the-counter medicines and would get relief. Headache is bitemporal and throbbing. She states it is worse than her previous migraines and severe. She is accompanied by her daughter-in-law. She had "a lot of stress" arranging and "caring for them" during the wedding last week. Daughter states when she has stress she gets headaches. Seen by her primary care physician today. Referred here as the patient had persistent symptoms. She had a negative sedimentation rate.  Has not had fever. She has not had sinus symptoms. Throbbing headache with nausea and photosensitivity. No neck pain. No extremity weakness or numbness.  HPI  Past Medical History:  Diagnosis Date  . Depression     There are no active problems to display for this patient.   Past Surgical History:  Procedure Laterality Date  . TUMOR REMOVAL      OB History    No data available       Home Medications    Prior to Admission medications   Medication Sig Start Date End Date Taking? Authorizing Provider  acetaminophen (TYLENOL) 325 MG tablet Take 650 mg by mouth every 6 (six) hours as needed for mild pain.   Yes Historical Provider, MD    Family History No family history on file.  Social History Social History  Substance Use Topics  . Smoking status: Never Smoker  . Smokeless tobacco: Never Used  . Alcohol use No     Allergies   Patient has no known allergies.   Review of Systems Review of Systems  Constitutional: Negative for appetite change, chills, diaphoresis, fatigue and fever.  HENT: Negative for mouth sores, sore throat and trouble swallowing.   Eyes: Positive for  photophobia. Negative for visual disturbance.  Respiratory: Negative for cough, chest tightness, shortness of breath and wheezing.   Cardiovascular: Negative for chest pain.  Gastrointestinal: Positive for nausea. Negative for abdominal distention, abdominal pain, diarrhea and vomiting.  Endocrine: Negative for polydipsia, polyphagia and polyuria.  Genitourinary: Negative for dysuria, frequency and hematuria.  Musculoskeletal: Negative for gait problem.  Skin: Negative for color change, pallor and rash.  Neurological: Positive for headaches. Negative for dizziness, syncope and light-headedness.  Hematological: Does not bruise/bleed easily.  Psychiatric/Behavioral: Negative for behavioral problems and confusion.     Physical Exam Updated Vital Signs BP 119/86   Pulse 91   Temp 98.1 F (36.7 C) (Oral)   Resp 20   SpO2 100%   Physical Exam  Constitutional: She is oriented to person, place, and time. She appears well-developed and well-nourished. No distress.  HENT:  Head: Normocephalic.  Eyes: Conjunctivae are normal. Pupils are equal, round, and reactive to light. No scleral icterus.  Neck: Normal range of motion. Neck supple. No thyromegaly present.  Cardiovascular: Normal rate and regular rhythm.  Exam reveals no gallop and no friction rub.   No murmur heard. Pulmonary/Chest: Effort normal and breath sounds normal. No respiratory distress. She has no wheezes. She has no rales.  Abdominal: Soft. Bowel sounds are normal. She exhibits no distension. There is no tenderness. There is no rebound.  Musculoskeletal: Normal range of motion.  Neurological: She is alert and oriented to  person, place, and time.  Normal cranial nerve exam. Reports normal vision. Normal strength and sensation for extremity. No pronator drift. Normal gait.  Skin: Skin is warm and dry. No rash noted.  Psychiatric: She has a normal mood and affect. Her behavior is normal.     ED Treatments / Results   Labs (all labs ordered are listed, but only abnormal results are displayed) Labs Reviewed - No data to display  EKG  EKG Interpretation None       Radiology No results found.  Procedures Procedures (including critical care time)  Medications Ordered in ED Medications  ketorolac (TORADOL) 30 MG/ML injection 30 mg (30 mg Intravenous Given 11/04/16 1552)  metoCLOPramide (REGLAN) injection 10 mg (10 mg Intravenous Given 11/04/16 1552)  diphenhydrAMINE (BENADRYL) injection 25 mg (25 mg Intravenous Given 11/04/16 1552)  dexamethasone (DECADRON) injection 10 mg (10 mg Intravenous Given 11/04/16 1552)  sodium chloride 0.9 % bolus 1,000 mL (1,000 mLs Intravenous New Bag/Given 11/04/16 1553)     Initial Impression / Assessment and Plan / ED Course  I have reviewed the triage vital signs and the nursing notes.  Pertinent labs & imaging results that were available during my care of the patient were reviewed by me and considered in my medical decision making (see chart for details).  Clinical Course     No focal neurological symptoms. No concerning historical symptoms to suggest hemorrhage or subarachnoid hemorrhage. Symptoms consistent with migraine and has history of migraine. Plan migraine cocktail with Decadron. Reevaluation.  16:20:  Patient reevaluated. She is sleeping soundly. Finishing her IV fluids. She will be appropriate for discharge to follow up with her primary care physician.  Final Clinical Impressions(s) / ED Diagnoses   Final diagnoses:  Migraine without status migrainosus, not intractable, unspecified migraine type   She was significant relief after Toradol, Reglan, Benadryl. Plan will be discharged home.  New Prescriptions New Prescriptions   No medications on file     Tanna Furry, MD 11/04/16 [place], MD 11/04/16 1704

## 2016-11-27 ENCOUNTER — Ambulatory Visit (INDEPENDENT_AMBULATORY_CARE_PROVIDER_SITE_OTHER): Payer: BLUE CROSS/BLUE SHIELD | Admitting: Neurology

## 2016-11-27 ENCOUNTER — Encounter: Payer: Self-pay | Admitting: Neurology

## 2016-11-27 DIAGNOSIS — G43709 Chronic migraine without aura, not intractable, without status migrainosus: Secondary | ICD-10-CM | POA: Diagnosis not present

## 2016-11-27 DIAGNOSIS — IMO0002 Reserved for concepts with insufficient information to code with codable children: Secondary | ICD-10-CM | POA: Insufficient documentation

## 2016-11-27 MED ORDER — RIZATRIPTAN BENZOATE 5 MG PO TBDP: 5.0000 mg | ORAL_TABLET | ORAL | 6 refills | Status: DC | PRN

## 2016-11-27 MED ORDER — NORTRIPTYLINE HCL 25 MG PO CAPS: 50.0000 mg | ORAL_CAPSULE | Freq: Every day | ORAL | 11 refills | Status: DC

## 2016-11-27 NOTE — Progress Notes (Signed)
PATIENT: Glenda Pham DOB: 15-Oct-1961  Chief Complaint  Patient presents with  . Headache    She is here with her friend, Sharee Pimple and an interpreter from [place]. Reports long history of headaches.  Over the last month, she has noticed an increase in frequency and intensity of pain.  She has been experiencing dizziness, blurred vision and nausea with these headaches.  She has been using OTC NSAIDS without relief.     HISTORICAL  Glenda Pham is a 55 years old right-handed female is with her friend Sharee Pimple, and interpreter seen in refer by her primary care doctor  Wendie Agreste for evaluation of headaches, initial evaluation was on November 27 2016.   She reported a history of migraine for 20 years, over the past 20 years, she has daily bilateral frontal pressure headaches, sometimes with more severe throbbing headache with associated light and noise sensitivity, lasting for hours to 1 day, for that reason she has been taking Tylenol daily for many years, which usually a upper headache effectively.  But since October 2017, she noticed daily headaches, especially at nighttime, she has difficulty falling to sleep, Tylenol longer helps,  She denies visual loss no lateralized motor or sensory deficit, she has never tried any other medication treatment in the past, other than over-the-counter medications.  REVIEW OF SYSTEMS: Full 14 system review of systems performed and notable only for depression, not enough sleep  ALLERGIES: No Known Allergies  HOME MEDICATIONS: Current Outpatient Prescriptions  Medication Sig Dispense Refill  . acetaminophen (TYLENOL) 325 MG tablet Take 650 mg by mouth every 6 (six) hours as needed for mild pain.     No current facility-administered medications for this visit.     PAST MEDICAL HISTORY: Past Medical History:  Diagnosis Date  . Depression   . Headache     PAST SURGICAL HISTORY: Past Surgical History:  Procedure Laterality Date  . HAND SURGERY  Right   . TUMOR REMOVAL     Left shoulder - benign    FAMILY HISTORY: Family History  Problem Relation Age of Onset  . Heart attack Mother   . Heart attack Father     SOCIAL HISTORY:  Social History   Social History  . Marital status: Married    Spouse name: N/A  . Number of children: 2  . Years of education: 6 years   Occupational History  . N[employer]    Social History Main Topics  . Smoking status: Never Smoker  . Smokeless tobacco: Never Used  . Alcohol use No  . Drug use: No  . Sexual activity: Not on file   Other Topics Concern  . Not on file   Social History Narrative   Lives at home with her husband.   Right-handed.   No caffeine use.     PHYSICAL EXAM   Vitals:   11/27/16 1011  BP: (!) 144/87  Pulse: 89  Weight: 115 lb (52.2 kg)  Height: 4\' 9"  (1.448 m)    Not recorded      Body mass index is 24.89 kg/m.  PHYSICAL EXAMNIATION:  Gen: NAD, conversant, well nourised, obese, well groomed                     Cardiovascular: Regular rate rhythm, no peripheral edema, warm, nontender. Eyes: Conjunctivae clear without exudates or hemorrhage Neck: Supple, no carotid bruits. Pulmonary: Clear to auscultation bilaterally   NEUROLOGICAL EXAM:  MENTAL STATUS: Speech:    Speech  is normal; fluent and spontaneous with normal comprehension.  Cognition:     Orientation to time, place and person     Normal recent and remote memory     Normal Attention span and concentration     Normal Language, naming, repeating,spontaneous speech     Fund of knowledge   CRANIAL NERVES: CN II: Visual fields are full to confrontation. Fundoscopic exam is normal with sharp discs and no vascular changes. Pupils are round equal and briskly reactive to light. CN III, IV, VI: extraocular movement are normal. No ptosis. CN V: Facial sensation is intact to pinprick in all 3 divisions bilaterally. Corneal responses are intact.  CN VII: Face is symmetric with normal eye  closure and smile. CN VIII: Hearing is normal to rubbing fingers CN IX, X: Palate elevates symmetrically. Phonation is normal. CN XI: Head turning and shoulder shrug are intact CN XII: Tongue is midline with normal movements and no atrophy.  MOTOR: There is no pronator drift of out-stretched arms. Muscle bulk and tone are normal. Muscle strength is normal.  REFLEXES: Reflexes are 2+ and symmetric at the biceps, triceps, knees, and ankles. Plantar responses are flexor.  SENSORY: Intact to light touch, pinprick, positional sensation and vibratory sensation are intact in fingers and toes.  COORDINATION: Rapid alternating movements and fine finger movements are intact. There is no dysmetria on finger-to-nose and heel-knee-shin.    GAIT/STANCE: Posture is normal. Gait is steady with normal steps, base, arm swing, and turning. Heel and toe walking are normal. Tandem gait is normal.  Romberg is absent.   DIAGNOSTIC DATA (LABS, IMAGING, TESTING) - I reviewed patient records, labs, notes, testing and imaging myself where available.   ASSESSMENT AND PLAN  Glenda Pham is a 55 y.o. female   Chronic migraines  Likely a component of medicine rebound headache with her daily Tylenol use  We will try preventive medications nortriptyline, 25 titrating to 2 mg every night  Maxalt 5 mg as needed   Marcial Pacas, M.D. Ph.D.  [facility] [address], [place], [place] [address] Ph: [phone] Fax: [phone]  CC: Wendie Agreste, MD

## 2016-12-02 ENCOUNTER — Telehealth: Payer: Self-pay | Admitting: Neurology

## 2016-12-02 ENCOUNTER — Other Ambulatory Visit: Payer: Self-pay | Admitting: *Deleted

## 2016-12-02 DIAGNOSIS — G4489 Other headache syndrome: Secondary | ICD-10-CM

## 2016-12-02 MED ORDER — TRAMADOL HCL 50 MG PO TABS: 50.0000 mg | ORAL_TABLET | Freq: Four times a day (QID) | ORAL | 0 refills | Status: DC | PRN

## 2016-12-02 NOTE — Telephone Encounter (Signed)
Per Dr. Krista Blue, place order for MRI brain wo contrast and provide rx for Tramadol #30 x 0.  Pt agreeable to this plan.  Rx faxed and confirmed to [facility] at [phone].

## 2016-12-02 NOTE — Telephone Encounter (Signed)
Patient daughter is calling stating the patient's headache is not any better even with medication  rizatriptan (MAXALT-MLT) 5 MG disintegrating tablet and  . nortriptyline (PAMELOR) 25 MG capsule. Daughter states the patient is very upset. Please call to discuss with patient's daughter.

## 2016-12-08 ENCOUNTER — Ambulatory Visit
Admission: RE | Admit: 2016-12-08 | Discharge: 2016-12-08 | Disposition: A | Discharge status: Home / Self Care | Payer: BLUE CROSS/BLUE SHIELD | Source: Ambulatory Visit | Attending: Neurology | Admitting: Neurology

## 2016-12-08 DIAGNOSIS — G4489 Other headache syndrome: Secondary | ICD-10-CM

## 2016-12-09 ENCOUNTER — Telehealth: Payer: Self-pay | Admitting: Neurology

## 2016-12-09 NOTE — Telephone Encounter (Signed)
Please call patient, MRI of the brain showed no significant abnormality. 

## 2016-12-09 NOTE — Telephone Encounter (Signed)
Spoke to pt's dgt, Oanh (on HIPAA) - she is aware of results.

## 2016-12-17 ENCOUNTER — Ambulatory Visit: Payer: BLUE CROSS/BLUE SHIELD | Admitting: Neurology

## 2017-01-27 ENCOUNTER — Ambulatory Visit: Payer: BLUE CROSS/BLUE SHIELD | Admitting: Nurse Practitioner

## 2017-01-28 ENCOUNTER — Encounter: Payer: Self-pay | Admitting: Nurse Practitioner

## 2017-02-16 ENCOUNTER — Ambulatory Visit (INDEPENDENT_AMBULATORY_CARE_PROVIDER_SITE_OTHER): Payer: BLUE CROSS/BLUE SHIELD | Admitting: Physician Assistant

## 2017-02-16 VITALS — BP 118/80 | HR 116 | Temp 98.1°F | Resp 16 | Ht <= 58 in | Wt 110.0 lb

## 2017-02-16 DIAGNOSIS — F418 Other specified anxiety disorders: Secondary | ICD-10-CM

## 2017-02-16 DIAGNOSIS — R630 Anorexia: Secondary | ICD-10-CM

## 2017-02-16 DIAGNOSIS — F419 Anxiety disorder, unspecified: Secondary | ICD-10-CM

## 2017-02-16 DIAGNOSIS — R079 Chest pain, unspecified: Secondary | ICD-10-CM | POA: Diagnosis not present

## 2017-02-16 DIAGNOSIS — F329 Major depressive disorder, single episode, unspecified: Secondary | ICD-10-CM

## 2017-02-16 DIAGNOSIS — R5383 Other fatigue: Secondary | ICD-10-CM

## 2017-02-16 LAB — POCT CBC
Granulocyte percent: 55.8 %G (ref 37–80)
HCT, POC: 39.9 % (ref 37.7–47.9)
HEMOGLOBIN: 13.9 g/dL (ref 12.2–16.2)
Lymph, poc: 3.1 (ref 0.6–3.4)
MCH, POC: 31.3 pg — AB (ref 27–31.2)
MCHC: 34.9 g/dL (ref 31.8–35.4)
MCV: 89.7 fL (ref 80–97)
MID (CBC): 0.4 (ref 0–0.9)
MPV: 8.3 fL (ref 0–99.8)
POC Granulocyte: 4.5 (ref 2–6.9)
POC LYMPH PERCENT: 39.2 %L (ref 10–50)
POC MID %: 5 % (ref 0–12)
Platelet Count, POC: 258 10*3/uL (ref 142–424)
RBC: 4.45 M/uL (ref 4.04–5.48)
RDW, POC: 12.6 %
WBC: 8 10*3/uL (ref 4.6–10.2)

## 2017-02-16 LAB — GLUCOSE, POCT (MANUAL RESULT ENTRY): POC Glucose: 91 mg/dl (ref 70–99)

## 2017-02-16 MED ORDER — HYDROXYZINE HCL 25 MG PO TABS: 25.0000 mg | ORAL_TABLET | Freq: Three times a day (TID) | ORAL | 1 refills | Status: DC | PRN

## 2017-02-16 MED ORDER — FLUOXETINE HCL 20 MG PO TABS: 20.0000 mg | ORAL_TABLET | Freq: Every day | ORAL | 3 refills | Status: DC

## 2017-02-16 NOTE — Progress Notes (Signed)
[facility] [address], [place] [address] [phone]  Date:  02/16/2017   Name:  Glenda Pham   DOB:  August 11, 1961   MRN:  [id number]  PCP:  Wendie Agreste, MD    History of Present Illness:  Glenda Pham is a 56 y.o. female patient who presents to [facility] for cc of fatigue and no appetite.  Patient reports no fever.  She does not experience heavy bleeding at night.  She feels sob when she is trying to sleep which keeps her up at night. No headache.   No hx of diabetes.   No nausea She is worried and stressed.  Family stressors at home.  Marital strife of son is highly stessful for her.  He lives away from home as do her children. She is unable to sleep at night.  She has lost interest in things she likes to do, 2 days of no appetite, no si or hi.  More agitated.  Loss of energy. Symptoms have occurred in the past and she has requested fluids which perk her up.         Patient Active Problem List   Diagnosis Date Noted  . Chronic migraine 11/27/2016    Past Medical History:  Diagnosis Date  . Depression   . Headache     Past Surgical History:  Procedure Laterality Date  . HAND SURGERY Right   . TUMOR REMOVAL     Left shoulder - benign    Social History  Substance Use Topics  . Smoking status: Never Smoker  . Smokeless tobacco: Never Used  . Alcohol use No    Family History  Problem Relation Age of Onset  . Heart attack Mother   . Heart attack Father     No Known Allergies  Medication list has been reviewed and updated.  No current outpatient prescriptions on file prior to visit.   No current facility-administered medications on file prior to visit.     ROS ROS otherwise unremarkable unless listed above.   Physical Examination: BP 118/80 (BP Location: Right Arm, Patient Position: Sitting, Cuff Size: Normal)   Pulse (!) 116   Temp 98.1 F (36.7 C) (Oral)   Resp 16   Ht _0  (1.448 m)   Wt 110 lb (49.9 kg)   SpO2 97%    BMI 23.80 kg/m  Ideal Body Weight: Weight in (lb) to have BMI = 25: 115.3  Physical Exam  Constitutional: She is oriented to person, place, and time. She appears well-developed and well-nourished. No distress.  HENT:  Head: Normocephalic and atraumatic.  Right Ear: External ear normal.  Left Ear: External ear normal.  Eyes: Conjunctivae and EOM are normal. Pupils are equal, round, and reactive to light.  Cardiovascular: Normal rate and regular rhythm.  Exam reveals no friction rub.   No murmur heard. Pulmonary/Chest: Effort normal. No respiratory distress. She has no wheezes.  Neurological: She is alert and oriented to person, place, and time.  Skin: Skin is warm and dry. She is not diaphoretic.  Psychiatric: She has a normal mood and affect. Her behavior is normal.    Results for orders placed or performed in visit on 02/16/17  POCT CBC  Result Value Ref Range   WBC 8.0 4.6 - 10.2 K/uL   Lymph, poc 3.1 0.6 - 3.4   POC LYMPH PERCENT 39.2 10 - 50 %L   MID (cbc) 0.4 0 - 0.9   POC MID % 5.0 0 -  12 %M   POC Granulocyte 4.5 2 - 6.9   Granulocyte percent 55.8 37 - 80 %G   RBC 4.45 4.04 - 5.48 M/uL   Hemoglobin 13.9 12.2 - 16.2 g/dL   HCT, POC 39.9 37.7 - 47.9 %   MCV 89.7 80 - 97 fL   MCH, POC 31.3 (A) 27 - 31.2 pg   MCHC 34.9 31.8 - 35.4 g/dL   RDW, POC 12.6 %   Platelet Count, POC 258 142 - 424 K/uL   MPV 8.3 0 - 99.8 fL  POCT glucose (manual entry)  Result Value Ref Range   POC Glucose 91 70 - 99 mg/dl    Wt Readings from Last 3 Encounters:  02/16/17 110 lb (49.9 kg)  11/27/16 115 lb (52.2 kg)  11/04/16 111 lb (50.3 kg)   Orthostatic VS for the past 24 hrs (Last 3 readings):  BP- Lying Pulse- Lying BP- Sitting Pulse- Sitting BP- Standing at 0 minutes Pulse- Standing at 0 minutes  02/16/17 1544 133/90 99 130/85 103 123/87 108     Assessment and Plan: Glenda Pham is a 56 y.o. female who is here today for cc of fatigue and no appetite. Likely manifestations of  anxiety and depression.  I am advising that she start an ssri.  Also given hydroxyzine for insomnia Advised to return in 4-6 weeks for recheck of her symptoms.  Advised hydration.  Return if symptoms do not improve.  Chest pain, unspecified type - Plan: POCT CBC, POCT glucose (manual entry), EKG 12-Lead  Other fatigue - Plan: POCT CBC, POCT glucose (manual entry), EKG 12-Lead  No appetite - Plan: POCT CBC, POCT glucose (manual entry), EKG 12-Lead  Anxiety and depression - Plan: CMP14+EGFR, TSH, FLUoxetine (PROZAC) 20 MG tablet, hydrOXYzine (ATARAX/VISTARIL) 25 MG tablet  Ivar Drape, PA-C [facility] 2/28/20187:37 PM

## 2017-02-16 NOTE — Patient Instructions (Addendum)
The prozac, you must take every day.  You will take half tablet daily for 1 week, then just take the one full tablet. The vistaril is for sleep and anxiety.  Know that it can make you tired, so do not operate heavy machinery.  You can try taking 1 tablet during the day, but 2 tablets at night for sleep.     IF you received an x-ray today, you will receive an invoice from [facility]. Please contact [facility] at [phone] with questions or concerns regarding your invoice.   IF you received labwork today, you will receive an invoice from [facility]. Please contact [facility] at [phone] with questions or concerns regarding your invoice.   Our billing staff will not be able to assist you with questions regarding bills from these companies.  You will be contacted with the lab results as soon as they are available. The fastest way to get your results is to activate your My Chart account. Instructions are located on the last page of this paperwork. If you have not heard from [place] regarding the results in 2 weeks, please contact this office.

## 2017-02-17 LAB — CMP14+EGFR
A/G RATIO: 1.7 (ref 1.2–2.2)
ALT: 15 IU/L (ref 0–32)
AST: 15 IU/L (ref 0–40)
Albumin: 4.7 g/dL (ref 3.5–5.5)
Alkaline Phosphatase: 79 IU/L (ref 39–117)
BUN/Creatinine Ratio: 20 (ref 9–23)
BUN: 12 mg/dL (ref 6–24)
Bilirubin Total: 1.3 mg/dL — ABNORMAL HIGH (ref 0.0–1.2)
CALCIUM: 9.4 mg/dL (ref 8.7–10.2)
CO2: 27 mmol/L (ref 18–29)
CREATININE: 0.6 mg/dL (ref 0.57–1.00)
Chloride: 100 mmol/L (ref 96–106)
GFR calc Af Amer: 119 mL/min/{1.73_m2} (ref >59)
GFR, EST NON AFRICAN AMERICAN: 103 mL/min/{1.73_m2} (ref >59)
GLUCOSE: 116 mg/dL — AB (ref 65–99)
Globulin, Total: 2.7 g/dL (ref 1.5–4.5)
POTASSIUM: 4.1 mmol/L (ref 3.5–5.2)
Sodium: 145 mmol/L — ABNORMAL HIGH (ref 134–144)
Total Protein: 7.4 g/dL (ref 6.0–8.5)

## 2017-02-17 LAB — TSH: TSH: 1.26 u[IU]/mL (ref 0.450–4.500)

## 2017-02-25 ENCOUNTER — Emergency Department (HOSPITAL_BASED_OUTPATIENT_CLINIC_OR_DEPARTMENT_OTHER): Payer: BLUE CROSS/BLUE SHIELD

## 2017-02-25 ENCOUNTER — Emergency Department (HOSPITAL_BASED_OUTPATIENT_CLINIC_OR_DEPARTMENT_OTHER)
Admission: EM | Admit: 2017-02-25 | Discharge: 2017-02-25 | Disposition: A | Discharge status: Home / Self Care | Payer: BLUE CROSS/BLUE SHIELD | Attending: Emergency Medicine | Admitting: Emergency Medicine

## 2017-02-25 ENCOUNTER — Encounter (HOSPITAL_BASED_OUTPATIENT_CLINIC_OR_DEPARTMENT_OTHER): Payer: Self-pay | Admitting: Emergency Medicine

## 2017-02-25 DIAGNOSIS — R531 Weakness: Secondary | ICD-10-CM | POA: Diagnosis present

## 2017-02-25 DIAGNOSIS — R5383 Other fatigue: Secondary | ICD-10-CM | POA: Insufficient documentation

## 2017-02-25 DIAGNOSIS — G479 Sleep disorder, unspecified: Secondary | ICD-10-CM | POA: Insufficient documentation

## 2017-02-25 DIAGNOSIS — R300 Dysuria: Secondary | ICD-10-CM | POA: Diagnosis not present

## 2017-02-25 LAB — URINALYSIS, ROUTINE W REFLEX MICROSCOPIC
Bilirubin Urine: NEGATIVE
GLUCOSE, UA: NEGATIVE mg/dL
Hgb urine dipstick: NEGATIVE
Ketones, ur: NEGATIVE mg/dL
LEUKOCYTES UA: NEGATIVE
Nitrite: NEGATIVE
PH: 6 (ref 5.0–8.0)
Protein, ur: NEGATIVE mg/dL
Specific Gravity, Urine: 1.003 — ABNORMAL LOW (ref 1.005–1.030)

## 2017-02-25 LAB — CBC WITH DIFFERENTIAL/PLATELET
Basophils Absolute: 0 10*3/uL (ref 0.0–0.1)
Basophils Relative: 0 %
Eosinophils Absolute: 0.1 10*3/uL (ref 0.0–0.7)
Eosinophils Relative: 1 %
HEMATOCRIT: 41.4 % (ref 36.0–46.0)
HEMOGLOBIN: 14 g/dL (ref 12.0–15.0)
LYMPHS ABS: 1.9 10*3/uL (ref 0.7–4.0)
LYMPHS PCT: 25 %
MCH: 30.4 pg (ref 26.0–34.0)
MCHC: 33.8 g/dL (ref 30.0–36.0)
MCV: 90 fL (ref 78.0–100.0)
Monocytes Absolute: 0.4 10*3/uL (ref 0.1–1.0)
Monocytes Relative: 5 %
NEUTROS PCT: 69 %
Neutro Abs: 5.2 10*3/uL (ref 1.7–7.7)
Platelets: 259 10*3/uL (ref 150–400)
RBC: 4.6 MIL/uL (ref 3.87–5.11)
RDW: 12.2 % (ref 11.5–15.5)
WBC: 7.6 10*3/uL (ref 4.0–10.5)

## 2017-02-25 LAB — COMPREHENSIVE METABOLIC PANEL
ALT: 15 U/L (ref 14–54)
AST: 24 U/L (ref 15–41)
Albumin: 4.2 g/dL (ref 3.5–5.0)
Alkaline Phosphatase: 66 U/L (ref 38–126)
Anion gap: 7 (ref 5–15)
BUN: 7 mg/dL (ref 6–20)
CHLORIDE: 103 mmol/L (ref 101–111)
CO2: 27 mmol/L (ref 22–32)
Calcium: 9.1 mg/dL (ref 8.9–10.3)
Creatinine, Ser: 0.74 mg/dL (ref 0.44–1.00)
Glucose, Bld: 121 mg/dL — ABNORMAL HIGH (ref 65–99)
POTASSIUM: 3.4 mmol/L — AB (ref 3.5–5.1)
SODIUM: 137 mmol/L (ref 135–145)
Total Bilirubin: 1.2 mg/dL (ref 0.3–1.2)
Total Protein: 7.6 g/dL (ref 6.5–8.1)

## 2017-02-25 LAB — TROPONIN I: Troponin I: <0.03 ng/mL (ref <0.03)

## 2017-02-25 MED ORDER — SODIUM CHLORIDE 0.9 % IV BOLUS (SEPSIS)
1000.0000 mL | Freq: Once | INTRAVENOUS | Status: AC
  Administered 2017-02-25: 1000 mL via INTRAVENOUS

## 2017-02-25 NOTE — ED Provider Notes (Signed)
[place] DEPT [facility] Provider Note   CSN: [id number] Arrival date & time: 02/25/17  [id number]     History   Chief Complaint Chief Complaint  Patient presents with  . Fatigue    HPI Glenda Pham is a 56 y.o. female.  Patient is a 56 year old female with past medical history of headaches, depression, and dehydration. She presents for evaluation of weakness, fatigue, and difficulty sleeping. This is worsened over the past several days. According to the patient and daughter, she has been given IV fluids when she has felt this way in the past and her symptoms have resolved. She denies any chest pain or shortness of breath. She denies any headaches, fevers, or other issues.  Patient speaks [place] and history was obtained through daughter who was present at bedside and acts as Optometrist.   The history is provided by the patient.    Past Medical History:  Diagnosis Date  . Depression   . Headache     Patient Active Problem List   Diagnosis Date Noted  . Chronic migraine 11/27/2016    Past Surgical History:  Procedure Laterality Date  . HAND SURGERY Right   . TUMOR REMOVAL     Left shoulder - benign    OB History    No data available       Home Medications    Prior to Admission medications   Medication Sig Start Date End Date Taking? Authorizing Provider  FLUoxetine (PROZAC) 20 MG tablet Take 1 tablet (20 mg total) by mouth daily. 02/16/17   Dorian Heckle English, PA  hydrOXYzine (ATARAX/VISTARIL) 25 MG tablet Take 1-2 tablets (25-50 mg total) by mouth every 8 (eight) hours as needed for anxiety (and sleep). 02/16/17   Joretta Bachelor, PA    Family History Family History  Problem Relation Age of Onset  . Heart attack Mother   . Heart attack Father     Social History Social History  Substance Use Topics  . Smoking status: Never Smoker  . Smokeless tobacco: Never Used  . Alcohol use No     Allergies   Patient has no known allergies.   Review of  Systems Review of Systems  All other systems reviewed and are negative.    Physical Exam Updated Vital Signs BP (!) 154/105 (BP Location: Left Arm)   Pulse 120   Temp 98.4 F (36.9 C) (Oral)   Resp 18   Ht 4\' 8"  (1.422 m)   Wt 115 lb (52.2 kg)   SpO2 97%   BMI 25.78 kg/m   Physical Exam  Constitutional: She is oriented to person, place, and time. She appears well-developed and well-nourished. No distress.  HENT:  Head: Normocephalic and atraumatic.  Mouth/Throat: Oropharynx is clear and moist.  Eyes: EOM are normal. Pupils are equal, round, and reactive to light.  Neck: Normal range of motion. Neck supple.  Cardiovascular: Normal rate and regular rhythm.  Exam reveals no gallop and no friction rub.   No murmur heard. Pulmonary/Chest: Effort normal and breath sounds normal. No respiratory distress. She has no wheezes.  Abdominal: Soft. Bowel sounds are normal. She exhibits no distension. There is no tenderness.  Musculoskeletal: Normal range of motion.  Neurological: She is alert and oriented to person, place, and time.  Skin: Skin is warm and dry. She is not diaphoretic.  Nursing note and vitals reviewed.    ED Treatments / Results  Labs (all labs ordered are listed, but only abnormal results are displayed) Labs Reviewed -  No data to display  EKG  EKG Interpretation  Date/Time:  Wednesday February 25 2017 13:15:50 EST Ventricular Rate:  92 PR Interval:    QRS Duration: 81 QT Interval:  360 QTC Calculation: 446 R Axis:   83 Text Interpretation:  Sinus rhythm Baseline wander in lead(s) V3 Confirmed by Jaymian Bogart  MD, Trace Cederberg ([id number]) on 02/25/2017 2:39:23 PM       Radiology No results found.  Procedures Procedures (including critical care time)  Medications Ordered in ED Medications  sodium chloride 0.9 % bolus 1,000 mL (not administered)     Initial Impression / Assessment and Plan / ED Course  I have reviewed the triage vital signs and the nursing  notes.  Pertinent labs & imaging results that were available during my care of the patient were reviewed by me and considered in my medical decision making (see chart for details).  This patient is a 56 year old [place] female who presents with multiple complaints. Initially she was complaining of weakness and was requesting IV fluids. Through her translator, I was told that she has had these episodes for years and in the past has received IV infusions in [place]. She is requesting hydration. She was given 1 L of normal saline, then requested a second.  Prior to the completion of the second liter of fluid, the initial family member that was acting as Optometrist had left, and the patient's sister arrived. She then inquired what was wrong with her sister and requested additional studies be obtained. CBC, BMP, EKG, and troponin were all obtained and were negative.  I returned to the room to discuss the results of these tests, and was informed that she was also having urinary burning. A urinalysis was then obtained and was negative for infection.  I have explained to the patient and her sister that I am unable to explain her multiple complaints and that nothing in her workup for physical exam appears abnormal. I have advised them that they are to follow-up with her primary Dr. if she is not improving and if these episodes continue. I spent a significant amount of time discussing the situation with the patient and both family members.  Final Clinical Impressions(s) / ED Diagnoses   Final diagnoses:  None    New Prescriptions New Prescriptions   No medications on file     Veryl Speak, MD 02/25/17 1443

## 2017-02-25 NOTE — ED Notes (Signed)
ED Provider at bedside. 

## 2017-02-25 NOTE — ED Notes (Signed)
Assumed care of patient from [place], [place]. Pt resting quietly at this time, in no distress, with no complaints. VSS. Family at bedside, IV Fluid continues.

## 2017-02-25 NOTE — ED Triage Notes (Signed)
Fatigue, loss of appetite, generalized weakness for 1 month. Went to walk in clinic last week and given meds. Pt states symptoms persist.

## 2017-02-25 NOTE — Discharge Instructions (Signed)
Drink plenty of fluids and get plenty of rest.  Follow-up with a primary care physician if your symptoms are not improving over the next week.

## 2017-02-26 ENCOUNTER — Ambulatory Visit (INDEPENDENT_AMBULATORY_CARE_PROVIDER_SITE_OTHER): Payer: BLUE CROSS/BLUE SHIELD | Admitting: Physician Assistant

## 2017-02-26 ENCOUNTER — Encounter: Payer: Self-pay | Admitting: Emergency Medicine

## 2017-02-26 VITALS — BP 138/100 | HR 106 | Temp 98.9°F | Resp 16 | Ht 59.0 in | Wt 112.4 lb

## 2017-02-26 DIAGNOSIS — R531 Weakness: Secondary | ICD-10-CM | POA: Diagnosis not present

## 2017-02-26 DIAGNOSIS — G4700 Insomnia, unspecified: Secondary | ICD-10-CM

## 2017-02-26 MED ORDER — LORAZEPAM 2 MG PO TABS: 2.0000 mg | ORAL_TABLET | Freq: Every evening | ORAL | 0 refills | Status: DC | PRN

## 2017-02-26 NOTE — Progress Notes (Signed)
[facility] [address] [place] [address] [phone]  Date:  02/26/2017   Name:  Glenda Pham   DOB:  04/05/1961   MRN:  [id number]  PCP:  Wendie Agreste, MD    History of Present Illness:  Glenda Pham is a 56 y.o. female patient who presents to [facility] for cc of elevated bp and follow up of her symptoms.  She has difficulty breathing for 2-3 days.   She attempts to eat, but has nausea.  She has 4 months of difficulty .  She had attempted to vistaril, but it did not help her to sleep.  She woke up feeling very sluggish.  She has difficulty with falling asleep.  It is difficult to get to sleep and stay asleep.  She did not take the medication.   She has dizziness, hands and feet are tremulous.   She feels very scared at night.  Scared of the darkness, and that she can't sleep makes her more scared.   She feels her symptoms are worsening from 2 weeks ago.   She currently has chest pain, and heaviness.   Laying down worsens her symptoms.   She has hot flash sensation.     Very tired hurts to talk.   Menses is irregular however not heavy   Patient Active Problem List   Diagnosis Date Noted  . Chronic migraine 11/27/2016    Past Medical History:  Diagnosis Date  . Depression   . Headache     Past Surgical History:  Procedure Laterality Date  . HAND SURGERY Right   . TUMOR REMOVAL     Left shoulder - benign    Social History  Substance Use Topics  . Smoking status: Never Smoker  . Smokeless tobacco: Never Used  . Alcohol use No    Family History  Problem Relation Age of Onset  . Heart attack Mother   . Heart attack Father     Allergies  Allergen Reactions  . Fluoxetine Hcl Other (See Comments)    Epistaxis once, hypertension?    Medication list has been reviewed and updated.  Current Outpatient Prescriptions on File Prior to Visit  Medication Sig Dispense Refill  . FLUoxetine (PROZAC) 20 MG tablet Take 1 tablet (20 mg total) by  mouth daily. 30 tablet 3  . hydrOXYzine (ATARAX/VISTARIL) 25 MG tablet Take 1-2 tablets (25-50 mg total) by mouth every 8 (eight) hours as needed for anxiety (and sleep). (Patient not taking: Reported on 03/03/2017) 30 tablet 1   No current facility-administered medications on file prior to visit.     ROS ROS otherwise unremarkable unless listed above  Physical Examination: BP (!) 138/100 (BP Location: Right Arm, Patient Position: Sitting, Cuff Size: Normal)   Pulse (!) 106   Temp 98.9 F (37.2 C) (Oral)   Resp 16   Ht 4\' 11"  (1.499 m)   Wt 112 lb 6.4 oz (51 kg)   LMP 10/08/2014   SpO2 97%   BMI 22.70 kg/m  Ideal Body Weight: Weight in (lb) to have BMI = 25: 123.5  Physical Exam  Constitutional: She is oriented to person, place, and time. She appears well-developed and well-nourished. No distress.  HENT:  Head: Normocephalic and atraumatic.  Right Ear: External ear normal.  Left Ear: External ear normal.  Eyes: Conjunctivae and EOM are normal. Pupils are equal, round, and reactive to light.  Cardiovascular: Normal rate and regular rhythm.   Pulmonary/Chest: Effort normal. No apnea. No  respiratory distress. She has no decreased breath sounds. She has no wheezes.  Neurological: She is alert and oriented to person, place, and time.  Skin: She is not diaphoretic.  Psychiatric: She has a normal mood and affect. Her behavior is normal.     Assessment and Plan: Glenda Pham is a 56 y.o. female who is here today for cc of elevated bp. bp did not appear to be the culprit of her symptoms initially, and she is anxious.  I will attempt a benzo for nighttime as we await optimal levels of the prozac.  Return in 2 weeks. Psychology consult appreciated at this time.  She will need an intepreter.  Insomnia, unspecified type - Plan: LORazepam (ATIVAN) 2 MG tablet, Ambulatory referral to Psychiatry  Weakness - Plan: Vitamin B12, Ambulatory referral to Psychiatry  Ivar Drape,  PA-C [facility] 3/18/20188:35 PM

## 2017-02-26 NOTE — Patient Instructions (Addendum)
We recommend that you schedule a mammogram for breast cancer screening. Typically, you do not need a referral to do this. Please contact a local imaging center to schedule your mammogram.  [facility] - [phone]  *ask for the Radiology Department [facility]) - [phone] or [phone]  [facility] - [phone] [place] [phone] [facility] - [phone]  *ask for the [facility] - [phone]  *ask for the Radiology Department [facility] - [phone]  *ask for the [facility] - [phone]  Please take the medication as prescribed.  I am referring you to a psychiatrist.   Please await that contact.  If your symptoms are improving.  Great.  But I need to see you in 4 weeks.   Continue the Prozac.  Do not take the vistaril or benadryl, with the ativan.  IF you received an x-ray today, you will receive an invoice from [facility]. Please contact [facility] Radiology at [phone] with questions or concerns regarding your invoice.   IF you received lab work today, you will receive an invoice from [employer]. Please contact [employer] at [phone] with questions or concerns regarding your invoice.   Our billing staff will not be able to assist you with questions regarding bills from these companies.  You will be contacted with the lab results as soon as they are available. The fastest way to get your results is to activate your My Chart account. Instructions are located on the last page of this paperwork. If you have not heard from [place] regarding the results in 2 weeks, please contact this office.

## 2017-02-27 LAB — VITAMIN B12: Vitamin B-12: 998 pg/mL (ref 232–1245)

## 2017-03-03 ENCOUNTER — Encounter (HOSPITAL_COMMUNITY): Payer: Self-pay

## 2017-03-03 ENCOUNTER — Emergency Department (HOSPITAL_COMMUNITY)
Admission: EM | Admit: 2017-03-03 | Discharge: 2017-03-03 | Disposition: A | Discharge status: Home / Self Care | Payer: BLUE CROSS/BLUE SHIELD | Attending: Emergency Medicine | Admitting: Emergency Medicine

## 2017-03-03 DIAGNOSIS — R5383 Other fatigue: Secondary | ICD-10-CM

## 2017-03-03 DIAGNOSIS — Z79899 Other long term (current) drug therapy: Secondary | ICD-10-CM | POA: Diagnosis not present

## 2017-03-03 DIAGNOSIS — I10 Essential (primary) hypertension: Secondary | ICD-10-CM | POA: Diagnosis not present

## 2017-03-03 LAB — URINALYSIS, ROUTINE W REFLEX MICROSCOPIC
Bilirubin Urine: NEGATIVE
Glucose, UA: NEGATIVE mg/dL
HGB URINE DIPSTICK: NEGATIVE
Ketones, ur: NEGATIVE mg/dL
Leukocytes, UA: NEGATIVE
Nitrite: NEGATIVE
PH: 7 (ref 5.0–8.0)
Protein, ur: NEGATIVE mg/dL
SPECIFIC GRAVITY, URINE: 1.002 — AB (ref 1.005–1.030)

## 2017-03-03 LAB — BASIC METABOLIC PANEL
ANION GAP: 8 (ref 5–15)
BUN: 6 mg/dL (ref 6–20)
CALCIUM: 9 mg/dL (ref 8.9–10.3)
CHLORIDE: 103 mmol/L (ref 101–111)
CO2: 27 mmol/L (ref 22–32)
Creatinine, Ser: 0.71 mg/dL (ref 0.44–1.00)
GFR calc non Af Amer: >60 mL/min (ref >60)
Glucose, Bld: 122 mg/dL — ABNORMAL HIGH (ref 65–99)
Potassium: 3.4 mmol/L — ABNORMAL LOW (ref 3.5–5.1)
Sodium: 138 mmol/L (ref 135–145)

## 2017-03-03 LAB — CBC
HEMATOCRIT: 37.3 % (ref 36.0–46.0)
HEMOGLOBIN: 12.4 g/dL (ref 12.0–15.0)
MCH: 30 pg (ref 26.0–34.0)
MCHC: 33.2 g/dL (ref 30.0–36.0)
MCV: 90.1 fL (ref 78.0–100.0)
Platelets: 238 10*3/uL (ref 150–400)
RBC: 4.14 MIL/uL (ref 3.87–5.11)
RDW: 12.2 % (ref 11.5–15.5)
WBC: 7.4 10*3/uL (ref 4.0–10.5)

## 2017-03-03 LAB — CBG MONITORING, ED: GLUCOSE-CAPILLARY: 111 mg/dL — AB (ref 65–99)

## 2017-03-03 MED ORDER — SODIUM CHLORIDE 0.9 % IV BOLUS (SEPSIS)
1000.0000 mL | Freq: Once | INTRAVENOUS | Status: AC
  Administered 2017-03-03: 1000 mL via INTRAVENOUS

## 2017-03-03 NOTE — ED Provider Notes (Signed)
[facility] DEPT Provider Note   CSN: [id number] Arrival date & time: 03/03/17  1808     History   Chief Complaint Chief Complaint  Patient presents with  . Hypertension  . Shortness of Breath  . Dizziness    HPI Glenda Pham is a 56 y.o. female.  Patient presents emergency department with chief complaint of fatigue. He states that she has been feeling tired for the past several weeks, but also reports having difficulty sleeping. She states that she feels out of energy and sometimes feels like she is out of breath. She states that she has been seen by her primary care doctor for the same, she is being treated with Ativan and Prozac. She reports some relief with Ativan, but states that she stopped taking the Prozac after she had a bloody nose. She denies any changes in her symptoms tonight. She states that she was seen recently at [facility] for similar symptoms and received a liter of fluid. She asks if she can have liter fluid here tonight. She denies any chest pain, or cough. Denies any fevers chills. Denies any abdominal pain. There are no other associated symptoms or modifying factors.   The history is provided by the patient. The history is limited by a language barrier. A language interpreter was used.    Past Medical History:  Diagnosis Date  . Depression   . Headache     Patient Active Problem List   Diagnosis Date Noted  . Chronic migraine 11/27/2016    Past Surgical History:  Procedure Laterality Date  . HAND SURGERY Right   . TUMOR REMOVAL     Left shoulder - benign    OB History    No data available       Home Medications    Prior to Admission medications   Medication Sig Start Date End Date Taking? Authorizing Provider  FLUoxetine (PROZAC) 20 MG tablet Take 1 tablet (20 mg total) by mouth daily. 02/16/17  Yes Stephanie D English, PA  LORazepam (ATIVAN) 2 MG tablet Take 1 tablet (2 mg total) by mouth at bedtime as needed for anxiety.  02/26/17  Yes Dorian Heckle English, PA  hydrOXYzine (ATARAX/VISTARIL) 25 MG tablet Take 1-2 tablets (25-50 mg total) by mouth every 8 (eight) hours as needed for anxiety (and sleep). Patient not taking: Reported on 03/03/2017 02/16/17   Joretta Bachelor, PA    Family History Family History  Problem Relation Age of Onset  . Heart attack Mother   . Heart attack Father     Social History Social History  Substance Use Topics  . Smoking status: Never Smoker  . Smokeless tobacco: Never Used  . Alcohol use No     Allergies   Fluoxetine hcl   Review of Systems Review of Systems  Constitutional: Positive for fatigue.  All other systems reviewed and are negative.    Physical Exam Updated Vital Signs BP 157/98   Pulse 89   Temp 98 F (36.7 C) (Oral)   Resp 18   Ht 4\' 11"  (1.499 m)   Wt 50.8 kg   LMP 10/08/2014   SpO2 100%   BMI 22.62 kg/m   Physical Exam  Constitutional: She is oriented to person, place, and time. She appears well-developed and well-nourished.  HENT:  Head: Normocephalic and atraumatic.  Eyes: Conjunctivae and EOM are normal. Pupils are equal, round, and reactive to light.  Neck: Normal range of motion. Neck supple.  Cardiovascular: Normal rate and  regular rhythm.  Exam reveals no gallop and no friction rub.   No murmur heard. Pulmonary/Chest: Effort normal and breath sounds normal. No respiratory distress. She has no wheezes. She has no rales. She exhibits no tenderness.  Abdominal: Soft. Bowel sounds are normal. She exhibits no distension and no mass. There is no tenderness. There is no rebound and no guarding.  Musculoskeletal: Normal range of motion. She exhibits no edema or tenderness.  Neurological: She is alert and oriented to person, place, and time.  Skin: Skin is warm and dry.  Psychiatric: She has a normal mood and affect. Her behavior is normal. Judgment and thought content normal.  Nursing note and vitals reviewed.    ED Treatments /  Results  Labs (all labs ordered are listed, but only abnormal results are displayed) Labs Reviewed  BASIC METABOLIC PANEL - Abnormal; Notable for the following:       Result Value   Potassium 3.4 (*)    Glucose, Bld 122 (*)    All other components within normal limits  URINALYSIS, ROUTINE W REFLEX MICROSCOPIC - Abnormal; Notable for the following:    Color, Urine COLORLESS (*)    Specific Gravity, Urine 1.002 (*)    All other components within normal limits  CBG MONITORING, ED - Abnormal; Notable for the following:    Glucose-Capillary 111 (*)    All other components within normal limits  CBC    EKG  EKG Interpretation  Date/Time:  Tuesday March 03 2017 18:13:42 EST Ventricular Rate:  96 PR Interval:  120 QRS Duration: 84 QT Interval:  348 QTC Calculation: 439 R Axis:   73 Text Interpretation:  Normal sinus rhythm Normal ECG Confirmed by HAVILAND MD, JULIE ([id number]) on 03/03/2017 9:53:13 PM       Radiology No results found.  Procedures Procedures (including critical care time)  Medications Ordered in ED Medications  sodium chloride 0.9 % bolus 1,000 mL (1,000 mLs Intravenous New Bag/Given 03/03/17 2109)     Initial Impression / Assessment and Plan / ED Course  I have reviewed the triage vital signs and the nursing notes.  Pertinent labs & imaging results that were available during my care of the patient were reviewed by me and considered in my medical decision making (see chart for details).     Patient with complaint of fatigue. She is overall well appearing. Her laboratory workup is reassuring. She has noted to be somewhat hypertensive, I recommend that she follow-up with her primary care provider for this. Upon chart review, she is seeing her primary care doctor and being treated for sleep disorder, and seems to be also being treated for anxiety and depression. I think that this is the cause of the patient's fatigue. I do not feel that further emergency workup is  indicated. The patient's vital signs are stable. Recommend that she follow-up with her primary care doctor later this week. She understands and agrees with the plan.  Final Clinical Impressions(s) / ED Diagnoses   Final diagnoses:  Fatigue, unspecified type    New Prescriptions New Prescriptions   No medications on file     Montine Circle, PA-C 03/03/17 2232    Isla Pence, MD 03/03/17 2242

## 2017-03-03 NOTE — ED Notes (Signed)
ED Provider at bedside. 

## 2017-03-03 NOTE — ED Triage Notes (Signed)
Per Pt, Pt is coming from home with complaints of dizziness, fatigue, SOB, and nausea that started a couple months ago. Pt was given medication to help her sleep and changed medication about a week and a half ago. Pt reports increased fatigue, dry mouth, and bitterness over the last couple months. Complains of thirst often.

## 2019-01-22 ENCOUNTER — Other Ambulatory Visit: Payer: Self-pay

## 2019-01-22 ENCOUNTER — Observation Stay (HOSPITAL_COMMUNITY)
Admission: EM | Admit: 2019-01-22 | Discharge: 2019-01-24 | Disposition: A | Discharge status: Home / Self Care | Payer: BLUE CROSS/BLUE SHIELD | Attending: Family Medicine | Admitting: Family Medicine

## 2019-01-22 ENCOUNTER — Encounter (HOSPITAL_COMMUNITY): Payer: Self-pay

## 2019-01-22 ENCOUNTER — Emergency Department (HOSPITAL_COMMUNITY): Payer: BLUE CROSS/BLUE SHIELD

## 2019-01-22 DIAGNOSIS — E876 Hypokalemia: Secondary | ICD-10-CM | POA: Diagnosis not present

## 2019-01-22 DIAGNOSIS — R000 Tachycardia, unspecified: Secondary | ICD-10-CM | POA: Diagnosis present

## 2019-01-22 DIAGNOSIS — Z8249 Family history of ischemic heart disease and other diseases of the circulatory system: Secondary | ICD-10-CM | POA: Insufficient documentation

## 2019-01-22 DIAGNOSIS — Z79899 Other long term (current) drug therapy: Secondary | ICD-10-CM | POA: Insufficient documentation

## 2019-01-22 DIAGNOSIS — F329 Major depressive disorder, single episode, unspecified: Secondary | ICD-10-CM | POA: Insufficient documentation

## 2019-01-22 DIAGNOSIS — J101 Influenza due to other identified influenza virus with other respiratory manifestations: Secondary | ICD-10-CM | POA: Diagnosis not present

## 2019-01-22 DIAGNOSIS — J9811 Atelectasis: Secondary | ICD-10-CM | POA: Insufficient documentation

## 2019-01-22 DIAGNOSIS — I10 Essential (primary) hypertension: Secondary | ICD-10-CM | POA: Diagnosis not present

## 2019-01-22 HISTORY — DX: Essential (primary) hypertension: I10

## 2019-01-22 LAB — URINALYSIS, ROUTINE W REFLEX MICROSCOPIC
Bilirubin Urine: NEGATIVE
Glucose, UA: NEGATIVE mg/dL
Hgb urine dipstick: NEGATIVE
Ketones, ur: NEGATIVE mg/dL
Leukocytes, UA: NEGATIVE
Nitrite: NEGATIVE
PH: 6 (ref 5.0–8.0)
Protein, ur: NEGATIVE mg/dL
Specific Gravity, Urine: 1.006 (ref 1.005–1.030)

## 2019-01-22 LAB — CBC WITH DIFFERENTIAL/PLATELET
Abs Immature Granulocytes: 0.02 10*3/uL (ref 0.00–0.07)
Basophils Absolute: 0 10*3/uL (ref 0.0–0.1)
Basophils Relative: 0 %
Eosinophils Absolute: 0.1 10*3/uL (ref 0.0–0.5)
Eosinophils Relative: 1 %
HCT: 40.7 % (ref 36.0–46.0)
Hemoglobin: 13.2 g/dL (ref 12.0–15.0)
Immature Granulocytes: 0 %
Lymphocytes Relative: 9 %
Lymphs Abs: 0.7 10*3/uL (ref 0.7–4.0)
MCH: 29.4 pg (ref 26.0–34.0)
MCHC: 32.4 g/dL (ref 30.0–36.0)
MCV: 90.6 fL (ref 80.0–100.0)
MONO ABS: 0.8 10*3/uL (ref 0.1–1.0)
Monocytes Relative: 10 %
NEUTROS ABS: 6.3 10*3/uL (ref 1.7–7.7)
Neutrophils Relative %: 80 %
Platelets: 233 10*3/uL (ref 150–400)
RBC: 4.49 MIL/uL (ref 3.87–5.11)
RDW: 11.9 % (ref 11.5–15.5)
WBC: 7.9 10*3/uL (ref 4.0–10.5)
nRBC: 0 % (ref 0.0–0.2)

## 2019-01-22 LAB — COMPREHENSIVE METABOLIC PANEL
ALT: 26 U/L (ref 0–44)
AST: 25 U/L (ref 15–41)
Albumin: 4.6 g/dL (ref 3.5–5.0)
Alkaline Phosphatase: 71 U/L (ref 38–126)
Anion gap: 12 (ref 5–15)
BUN: 8 mg/dL (ref 6–20)
CO2: 27 mmol/L (ref 22–32)
Calcium: 9.3 mg/dL (ref 8.9–10.3)
Chloride: 99 mmol/L (ref 98–111)
Creatinine, Ser: 0.82 mg/dL (ref 0.44–1.00)
GFR calc non Af Amer: >60 mL/min (ref >60)
Glucose, Bld: 122 mg/dL — ABNORMAL HIGH (ref 70–99)
Potassium: 3.2 mmol/L — ABNORMAL LOW (ref 3.5–5.1)
Sodium: 138 mmol/L (ref 135–145)
Total Bilirubin: 1.6 mg/dL — ABNORMAL HIGH (ref 0.3–1.2)
Total Protein: 8.1 g/dL (ref 6.5–8.1)

## 2019-01-22 LAB — I-STAT BETA HCG BLOOD, ED (MC, WL, AP ONLY): I-stat hCG, quantitative: <5 m[IU]/mL (ref <5)

## 2019-01-22 LAB — LACTIC ACID, PLASMA
LACTIC ACID, VENOUS: 1.4 mmol/L (ref 0.5–1.9)
Lactic Acid, Venous: 1.6 mmol/L (ref 0.5–1.9)

## 2019-01-22 LAB — PROTIME-INR
INR: 1.01
Prothrombin Time: 13.2 seconds (ref 11.4–15.2)

## 2019-01-22 LAB — GROUP A STREP BY PCR: Group A Strep by PCR: NOT DETECTED

## 2019-01-22 LAB — INFLUENZA PANEL BY PCR (TYPE A & B)
Influenza A By PCR: POSITIVE — AB
Influenza B By PCR: NEGATIVE

## 2019-01-22 MED ORDER — SODIUM CHLORIDE 0.9 % IV BOLUS
500.0000 mL | Freq: Once | INTRAVENOUS | Status: AC
  Administered 2019-01-22: 500 mL via INTRAVENOUS

## 2019-01-22 MED ORDER — SODIUM CHLORIDE 0.9 % IV SOLN
2.0000 g | Freq: Once | INTRAVENOUS | Status: AC
  Administered 2019-01-22: 2 g via INTRAVENOUS
  Filled 2019-01-22: qty 2

## 2019-01-22 MED ORDER — ACETAMINOPHEN 650 MG RE SUPP: 650.0000 mg | Freq: Four times a day (QID) | RECTAL | Status: DC | PRN

## 2019-01-22 MED ORDER — SODIUM CHLORIDE 0.9 % IV BOLUS
1000.0000 mL | Freq: Once | INTRAVENOUS | Status: AC
  Administered 2019-01-22: 1000 mL via INTRAVENOUS

## 2019-01-22 MED ORDER — OSELTAMIVIR PHOSPHATE 75 MG PO CAPS
75.0000 mg | ORAL_CAPSULE | Freq: Once | ORAL | Status: AC
  Administered 2019-01-22: 75 mg via ORAL
  Filled 2019-01-22: qty 1

## 2019-01-22 MED ORDER — POLYETHYLENE GLYCOL 3350 17 G PO PACK: 17.0000 g | PACK | Freq: Every day | ORAL | Status: DC | PRN

## 2019-01-22 MED ORDER — ENOXAPARIN SODIUM 40 MG/0.4ML ~~LOC~~ SOLN
40.0000 mg | SUBCUTANEOUS | Status: DC
  Administered 2019-01-22 – 2019-01-23 (×2): 40 mg via SUBCUTANEOUS
  Filled 2019-01-22 (×2): qty 0.4

## 2019-01-22 MED ORDER — SODIUM CHLORIDE 0.9 % IV SOLN
1.0000 g | Freq: Three times a day (TID) | INTRAVENOUS | Status: DC
  Filled 2019-01-22 (×2): qty 1

## 2019-01-22 MED ORDER — SODIUM CHLORIDE 0.9 % IV SOLN
INTRAVENOUS | Status: DC
  Administered 2019-01-22: 21:00:00 via INTRAVENOUS

## 2019-01-22 MED ORDER — ACETAMINOPHEN 325 MG PO TABS
650.0000 mg | ORAL_TABLET | Freq: Four times a day (QID) | ORAL | Status: DC | PRN
  Administered 2019-01-22 – 2019-01-23 (×3): 650 mg via ORAL
  Filled 2019-01-22 (×3): qty 2

## 2019-01-22 MED ORDER — OSELTAMIVIR PHOSPHATE 30 MG PO CAPS
30.0000 mg | ORAL_CAPSULE | Freq: Two times a day (BID) | ORAL | Status: DC
  Filled 2019-01-22: qty 1

## 2019-01-22 MED ORDER — METRONIDAZOLE IN NACL 5-0.79 MG/ML-% IV SOLN: 500.0000 mg | Freq: Three times a day (TID) | INTRAVENOUS | Status: DC

## 2019-01-22 MED ORDER — SODIUM CHLORIDE 0.9% FLUSH
3.0000 mL | Freq: Once | INTRAVENOUS | Status: AC
  Administered 2019-01-22: 3 mL via INTRAVENOUS

## 2019-01-22 MED ORDER — OSELTAMIVIR PHOSPHATE 75 MG PO CAPS: 75.0000 mg | ORAL_CAPSULE | Freq: Two times a day (BID) | ORAL | Status: DC

## 2019-01-22 MED ORDER — SODIUM CHLORIDE 0.9 % IV SOLN
INTRAVENOUS | Status: DC
  Administered 2019-01-22: 17:00:00 via INTRAVENOUS

## 2019-01-22 MED ORDER — OSELTAMIVIR PHOSPHATE 30 MG PO CAPS
30.0000 mg | ORAL_CAPSULE | Freq: Two times a day (BID) | ORAL | Status: DC
  Administered 2019-01-23 – 2019-01-24 (×3): 30 mg via ORAL
  Filled 2019-01-22 (×4): qty 1

## 2019-01-22 MED ORDER — POTASSIUM CHLORIDE CRYS ER 20 MEQ PO TBCR
40.0000 meq | EXTENDED_RELEASE_TABLET | Freq: Two times a day (BID) | ORAL | Status: AC
  Administered 2019-01-22 – 2019-01-23 (×2): 40 meq via ORAL
  Filled 2019-01-22 (×2): qty 2

## 2019-01-22 MED ORDER — VANCOMYCIN HCL IN DEXTROSE 1-5 GM/200ML-% IV SOLN
1000.0000 mg | Freq: Once | INTRAVENOUS | Status: AC
  Administered 2019-01-22: 1000 mg via INTRAVENOUS

## 2019-01-22 MED ORDER — VANCOMYCIN HCL IN DEXTROSE 1-5 GM/200ML-% IV SOLN: 1000.0000 mg | INTRAVENOUS | Status: DC

## 2019-01-22 MED ORDER — ACETAMINOPHEN 325 MG PO TABS
650.0000 mg | ORAL_TABLET | Freq: Once | ORAL | Status: AC
  Administered 2019-01-22: 650 mg via ORAL
  Filled 2019-01-22: qty 2

## 2019-01-22 NOTE — Progress Notes (Signed)
Pharmacy Antibiotic Note  Cai Flott is a 58 y.o. female admitted on 01/22/2019 with sepsis.  Pharmacy has been consulted for Vancomycin and Cefepime dosing.  Plan: Vancomycin 1000 mg IV q24hr (approx AUC 465) Cefepime 1 gm IV q8hr Monitor renal function, C&S, and vanc levels as appropriate   Height: 4' 7.12" (140 cm) Weight: 112 lb 7 oz (51 kg) IBW/kg (Calculated) : 34.27  Temp (24hrs), Avg:103.2 F (39.6 C), Min:103.2 F (39.6 C), Max:103.2 F (39.6 C)  Recent Labs  Lab 01/22/19 1458  WBC 7.9  CREATININE 0.82  LATICACIDVEN 1.6    Estimated Creatinine Clearance: 49 mL/min (by C-G formula based on SCr of 0.82 mg/dL).    Allergies  Allergen Reactions  . Fluoxetine Hcl Other (See Comments)    Epistaxis once, hypertension?    Antimicrobials this admission: Vanc 1/25 >>  Cefepime 1/25 >>  Flagyl 1/25 >>  Thank you for allowing pharmacy to be a part of this patient's care.  Alanda Slim, PharmD, [facility] Clinical Pharmacist Please see AMION for all Pharmacists' Contact Phone Numbers 01/22/2019, 4:34 PM

## 2019-01-22 NOTE — Progress Notes (Signed)
PHARMACY NOTE:  ANTIMICROBIAL RENAL DOSAGE ADJUSTMENT  Current antimicrobial regimen includes a mismatch between antimicrobial dosage and estimated renal function.  As per policy approved by the Pharmacy & Therapeutics and Medical Executive Committees, the antimicrobial dosage will be adjusted accordingly.  Current antimicrobial dosage:  Tamiflu 75mg  PO BID  Indication: influenza  Renal Function:  Estimated Creatinine Clearance: 49 mL/min (by C-G formula based on SCr of 0.82 mg/dL). []      On intermittent HD, scheduled: []      On CRRT    Antimicrobial dosage has been changed to:  Tamiflu 30mg  PO BID  Additional comments:   Leam Madero A. Levada Dy, PharmD, [facility] Pager: [phone] Please utilize Amion for appropriate phone number to reach the unit pharmacist ([place])   01/22/2019 8:32 PM

## 2019-01-22 NOTE — ED Triage Notes (Signed)
Pt reports 2 days of sore throat, non productive cough and fever. Pt reports family has had similar symptoms for the past week. No recent travel but reports her daughter just returned from [place] 2 days ago. Pt a.o, vietnamese interpreter used for triage.

## 2019-01-22 NOTE — Progress Notes (Signed)
Family medicine paged to make aware of heart rate. Parameters for high page if above 150.

## 2019-01-22 NOTE — H&P (Addendum)
[facility] Admission History and Physical Service Pager: [phone]  Patient name: Glenda Pham Medical record number: [id number] Date of birth: [birth year] Age: 58 y.o. Gender: female  Primary Care Provider: Wendie Agreste, MD Consultants: ID (curbside) Code Status: Full   Chief Complaint: Fever and cough   Assessment and Plan: Glenda Pham is a 58 y.o. female presenting with fever and cough . PMH is significant for HTN  Flu A with tachycardia Patient presenting with symptoms of fever and cough x2 days.  In emergency department patient was febrile to 103.2 with tachycardia to 122. Initially meeting sepsis criteria but improved with fluids and tyelnol. qSOFA 0. Lactic acids have remained normal.  No leukocytosis.  CXR with minimal basial atelectasis. Lungs CTAB. Flu swab positive for flu A. Concern for possible coronavirus given recent exposure to family from [place]. [place] consulted as curbside consult I recommended no further testing needs to be treated.  Per Dr. Albertine Patricia will need to treat as normal flu.  Droplet precautions are enough.  We will plan to admit for fluids as patient remains tachycardic despite fluids in ED.  EKG showing sinus tachycardia.  -Admit to med telemetry, attending Dr. Nori Riis -Vital signs every 4 hours -Continuous cardiac monitoring -Tamiflu 75 mg twice daily -All in all as needed -Normal saline at 100 cc/hour -A.m. CBC/BMP -Status post Vanc, cefepime, Flagyl in ED, no need to continue abx -trend fever curve -droplet precautions  Hypokalemia K of 3.2 on admission.  -monitor on daily BMP -replete as needed  HTN Patient reports past medical history of hypertension.  States that she received 1 pill for high blood pressure in [place].  Unable to tell me what medication this is.  Given that it is unclear which medication she is on and patient has not been med rec to yet we will hold off on medications at this point. -Hold medications,  can restart as needed -Monitor vitals closely  FEN/GI: regular diet Prophylaxis: lovenox   Disposition: admit to med tele, attending Dr. Nori Riis   History of Present Illness:  Glenda Pham is a 58 y.o. female presenting with fever and cough x2 days.   Patient states that she has had a subjective fever and cough x2 days.  Does have cough with no sputum production.  States that her entire family has the symptoms.  Denies any abdominal pain.  Does endorse sore throat and headache as well.  There is associated photo and phonophobia.  Does have poor appetite but has been able to drink well.  Denies chest pain.  No nausea or vomiting.  No urinary symptoms.  Does have some dizziness.  Patient denies recent travel, however, family just came back from [place] 3 days ago.  States that symptoms happened around the same time family came back.  Patient states she does not think family had any sick contacts in [place] or exposure to any unusual animals.  Patient reports she is on 1 pill for blood pressure but is unable to tell me which medication this is.  States that she is on no other chronic medications.  Only takes Tylenol as needed tensions for fever.    In ED patient initially met sepsis criteria and abx were initiated. Found to be Flu A positive and tamiflu was started. Vitals improved with fluids but remains tachycardic. Called for admission for tachycardia.   Review Of Systems: Per HPI with the following additions:  Review of Systems  Constitutional: Positive for chills and fever.  HENT:  Positive for sore throat.   Respiratory: Positive for cough. Negative for shortness of breath.   Cardiovascular: Positive for chest pain.  Gastrointestinal: Negative for abdominal pain, nausea and vomiting.  Genitourinary: Negative for dysuria and frequency.  Musculoskeletal: Positive for myalgias.  Neurological: Positive for dizziness.    Patient Active Problem List   Diagnosis Date Noted  . Flu 01/22/2019   . Chronic migraine 11/27/2016    Past Medical History: Past Medical History:  Diagnosis Date  . Depression   . Headache   . Hypertension     Past Surgical History: Past Surgical History:  Procedure Laterality Date  . HAND SURGERY Right   . TUMOR REMOVAL     Left shoulder - benign    Social History: Social History   Tobacco Use  . Smoking status: Never Smoker  . Smokeless tobacco: Never Used  Substance Use Topics  . Alcohol use: No  . Drug use: No    Please also refer to relevant sections of EMR.  Family History: Family History  Problem Relation Age of Onset  . Heart attack Mother   . Heart attack Father     Allergies and Medications: Allergies  Allergen Reactions  . Fluoxetine Hcl Other (See Comments)    Epistaxis once, hypertension?   No current facility-administered medications on file prior to encounter.    Current Outpatient Medications on File Prior to Encounter  Medication Sig Dispense Refill  . FLUoxetine (PROZAC) 20 MG tablet Take 1 tablet (20 mg total) by mouth daily. 30 tablet 3  . hydrOXYzine (ATARAX/VISTARIL) 25 MG tablet Take 1-2 tablets (25-50 mg total) by mouth every 8 (eight) hours as needed for anxiety (and sleep). (Patient not taking: Reported on 03/03/2017) 30 tablet 1  . LORazepam (ATIVAN) 2 MG tablet Take 1 tablet (2 mg total) by mouth at bedtime as needed for anxiety. 30 tablet 0    Objective: BP 122/90   Pulse (!) 126   Temp (!) 103.2 F (39.6 C) (Oral)   Resp 20   Ht 4' 7.12" (1.4 m)   Wt 51 kg   LMP 10/08/2014   SpO2 94%   BMI 26.02 kg/m  Exam: General: awake and alert, laying in bed Eyes: PERRL, conjunctival injection  ENTM: moist mucous membranes  Neck: no LAD Cardiovascular: RRR, no MRG Respiratory: CTAB, no MRG Gastrointestinal: soft, non tender, non distended, bowel sounds normal  MSK: no edema, non tender  Derm: linear erythematous lines on neck  Neuro: no focal deficits, alert  Psych: normal affect  Labs and  Imaging: CBC BMET  Recent Labs  Lab 01/22/19 1458  WBC 7.9  HGB 13.2  HCT 40.7  PLT 233   Recent Labs  Lab 01/22/19 1458  NA 138  K 3.2*  CL 99  CO2 27  BUN 8  CREATININE 0.82  GLUCOSE 122*  CALCIUM 9.3      Ref. Range 01/22/2019 14:58 01/22/2019 17:05  Lactic Acid, Venous Latest Ref Range: 0.5 - 1.9 mmol/L 1.6 1.4    Ref. Range 01/22/2019 15:49  Influenza A By PCR Latest Ref Range: NEGATIVE  POSITIVE (A)  Influenza B By PCR Latest Ref Range: NEGATIVE  NEGATIVE    Ref. Range 01/22/2019 15:49  Group A Strep by PCR Latest Ref Range: NOT DETECTED  NOT DETECTED   Dg Chest 2 View  Result Date: 01/22/2019 CLINICAL DATA:  Sore throat for 2 days EXAM: CHEST - 2 VIEW COMPARISON:  02/25/2017 FINDINGS: Cardiac shadows within normal limits. The  lungs are well aerated bilaterally. Minimal right basilar atelectasis is seen. No sizable effusion is noted. No bony abnormality is noted. IMPRESSION: Minimal right basilar atelectasis. Electronically Signed   By: Inez Catalina M.D.   On: 01/22/2019 15:37    Caroline More, DO 01/22/2019, 8:04 PM PGY-2, [facility] pager: [phone], text pages welcome

## 2019-01-22 NOTE — ED Notes (Signed)
Per Admitting MD, OK to give dose of Tylenol now. MD aware dose is over an hour early.

## 2019-01-23 DIAGNOSIS — J101 Influenza due to other identified influenza virus with other respiratory manifestations: Secondary | ICD-10-CM | POA: Diagnosis not present

## 2019-01-23 DIAGNOSIS — R000 Tachycardia, unspecified: Secondary | ICD-10-CM | POA: Diagnosis not present

## 2019-01-23 DIAGNOSIS — I10 Essential (primary) hypertension: Secondary | ICD-10-CM | POA: Diagnosis not present

## 2019-01-23 LAB — CBC
HCT: 38.4 % (ref 36.0–46.0)
HEMOGLOBIN: 13 g/dL (ref 12.0–15.0)
MCH: 30.2 pg (ref 26.0–34.0)
MCHC: 33.9 g/dL (ref 30.0–36.0)
MCV: 89.3 fL (ref 80.0–100.0)
Platelets: 222 10*3/uL (ref 150–400)
RBC: 4.3 MIL/uL (ref 3.87–5.11)
RDW: 12 % (ref 11.5–15.5)
WBC: 8 10*3/uL (ref 4.0–10.5)
nRBC: 0 % (ref 0.0–0.2)

## 2019-01-23 LAB — BASIC METABOLIC PANEL
ANION GAP: 8 (ref 5–15)
BUN: 7 mg/dL (ref 6–20)
CO2: 26 mmol/L (ref 22–32)
Calcium: 8.5 mg/dL — ABNORMAL LOW (ref 8.9–10.3)
Chloride: 106 mmol/L (ref 98–111)
Creatinine, Ser: 0.67 mg/dL (ref 0.44–1.00)
GFR calc Af Amer: >60 mL/min (ref >60)
GFR calc non Af Amer: >60 mL/min (ref >60)
Glucose, Bld: 156 mg/dL — ABNORMAL HIGH (ref 70–99)
POTASSIUM: 3.4 mmol/L — AB (ref 3.5–5.1)
Sodium: 140 mmol/L (ref 135–145)

## 2019-01-23 LAB — HIV ANTIBODY (ROUTINE TESTING W REFLEX): HIV Screen 4th Generation wRfx: NONREACTIVE

## 2019-01-23 MED ORDER — IBUPROFEN 800 MG PO TABS: 800.0000 mg | ORAL_TABLET | Freq: Three times a day (TID) | ORAL | Status: DC | PRN

## 2019-01-23 NOTE — Discharge Summary (Signed)
[facility] Discharge Summary  Patient name: Glenda Pham Medical record number: [id number] Date of birth: 1961/11/10 Age: 58 y.o. Gender: female Date of Admission: 01/22/2019  Date of Discharge: 01/24/2019 Admitting Physician: Dickie La, MD  Primary Care Provider: Wendie Agreste, MD Consultants: none  Indication for Hospitalization: respiratory distress  Discharge Diagnoses/Problem List:  Influenza A Hypertension   Disposition: discharge home  Discharge Condition: improved, stable  Discharge Exam:  BP 104/77   Pulse 96   Temp 98 F (36.7 C)   Resp 14   Ht 4' 9.09" (1.45 m)   Wt 54.2 kg   LMP 10/08/2014   SpO2 100%   BMI 25.78 kg/m  Gen: NAD, alert, non-toxic, well-nourished, well-appearing, sitting comfortably  HEENT: Normocephaic, atraumatic. Clear conjuctiva, no scleral icterus CV: Regular rate and rhythm, borderline tachycardic.. Radial pulses 2+ bilaterally. No bilateral lower extremity edema. Resp: lungs clear to auscultation bilaterally No increased work of breathing appreciated. Abd: Nontender and nondistended on palpation to all 4 quadrants.  Positive bowel sounds. Psych: Cooperative with exam. Pleasant. Makes eye contact.  Brief Hospital Course:  Patient presented with fever and cough. She has close contacts with similar symptoms after travel to [place]. Tested positive for influenza A. LA was not elevated. She was treated with IV fluids, and tamiflu. She remained febrile throughout her stay with a tmax 103.2. On day of discharge, she was able to tolerate ambulation on room air without desaturation and had good PO intake. She was sent home with tamiflu.   Hypokalemia: patient's potassium was borderline low throughout her stay ( 3.2-3.4).  She was given K-dur supplementation but it did not increase.  Magnesium was checked and was normal.  Patient was sent home with one week of potassium supplementation.   Tachycardia- HR remained  elevated throughout stay but was improved with supportive management. On day of discharge, her heart rate was normal.    Issues for Follow Up:  1. Flu - patient did not have respiratory symptoms during her stay but did appear dehydrated.  She was given fluids and a bolus the day of discharge.  Check patient hydration status on follow up. See if she took the remainder of her 5 day course of tamiflu 2. Hypokalemia - patient was given 7 days of potasium supplementation on discharge.  Consider checking bmp for potassium levels.  3. Tachycardia - heart rate was slightly elevated throughout her stay.  Probably due to influenza.  Check heart rate on followup.   Significant Procedures: none  Significant Labs and Imaging:  Recent Labs  Lab 01/22/19 1458 01/23/19 0008 01/24/19 0307  WBC 7.9 8.0 4.2  HGB 13.2 13.0 11.6*  HCT 40.7 38.4 36.1  PLT 233 222 170   Recent Labs  Lab 01/22/19 1458 01/23/19 0008 01/24/19 0307  NA 138 140 137  K 3.2* 3.4* 3.3*  CL 99 106 102  CO2 27 26 25   GLUCOSE 122* 156* 103*  BUN 8 7 13   CREATININE 0.82 0.67 0.90  CALCIUM 9.3 8.5* 8.5*  MG  --   --  2.4  ALKPHOS 71  --   --   AST 25  --   --   ALT 26  --   --   ALBUMIN 4.6  --   --    Influenza A positive  Dg Chest 2 View  Result Date: 01/22/2019 CLINICAL DATA:  Sore throat for 2 days EXAM: CHEST - 2 VIEW COMPARISON:  02/25/2017 FINDINGS: Cardiac shadows  within normal limits. The lungs are well aerated bilaterally. Minimal right basilar atelectasis is seen. No sizable effusion is noted. No bony abnormality is noted. IMPRESSION: Minimal right basilar atelectasis. Electronically Signed   By: Inez Catalina M.D.   On: 01/22/2019 15:37   Results/Tests Pending at Time of Discharge: none  Discharge Medications:  Allergies as of 01/24/2019      Reactions   Fluoxetine Hcl Other (See Comments)   Epistaxis once, hypertension?   Vancomycin Itching      Medication List    TAKE these medications    acetaminophen 325 MG tablet Commonly known as:  TYLENOL Take 325 mg by mouth daily as needed for mild pain.   amLODipine 5 MG tablet Commonly known as:  NORVASC Take 5 mg by mouth daily.   meclizine 12.5 MG tablet Commonly known as:  ANTIVERT Take 12.5 mg by mouth 3 (three) times daily as needed for dizziness or nausea.   oseltamivir 30 MG capsule Commonly known as:  TAMIFLU Take 1 capsule (30 mg total) by mouth 2 (two) times daily for 4 days.   potassium chloride SA 20 MEQ tablet Commonly known as:  K-DUR,KLOR-CON Take 2 tablets (40 mEq total) by mouth daily for 7 days.       Discharge Instructions: Please refer to Patient Instructions section of EMR for full details.  Patient was counseled important signs and symptoms that should prompt return to medical care, changes in medications, dietary instructions, activity restrictions, and follow up appointments.   Follow-Up Appointments: Follow-up Information    Wendie Agreste, MD. Schedule an appointment as soon as possible for a visit.   Specialties:  Family Medicine, Sports Medicine Why:  please make follow up appointment within the next one to two weeks Contact information: [place] [address] [phone]           Benay Pike, MD 01/24/2019, 2:55 PM PGY-1, [place]

## 2019-01-23 NOTE — Progress Notes (Signed)
Family Medicine Teaching Service Daily Progress Note Intern Pager: [phone]  Patient name: Glenda Pham Medical record number: [id number] Date of birth: 02-10-1961 Age: 58 y.o. Gender: female  Primary Care Provider: Wendie Agreste, MD Consultants: none Code Status: Full code  Pt Overview and Major Events to Date:  Hospital Day 0 Admitted: 01/22/2019  Assessment and Plan: Glenda Pham is a 58 y.o. female presenting with fever and cough . PMH is significant for HTN. Febrile 103.2 overnight.    Flu A with tachycardia P/w fever and cough x 2 days. Currently afebrile.  Flu A positive. No leukocytosis. CXR w/ minimal basal atelectasis. Recent trip to [place].  Tachycardia of 103 this am, improved from overnight.  - vital signs q4h - continiuous cardiac monitoring - tamiflu 75mg  BID - NS 167ml/hr - am cbc/bmp - trend fever curve - droplet precautions   Hypokalemia K of 3.2 on admission. 3.4 this AM. Repleted.  -monitor on daily BMP -replete as needed  HTN Patient reports past medical history of hypertension.  takes amlodipine 5mg  at home. Low normotensive this morning.  -Hold medications, can restart as needed -Monitor vitals closely   Electrolytes: replete K as needed.   Nutrition: heart healthy GI ppx: none DVT px: lovenox  Disposition: home   Medications: Scheduled Meds: . enoxaparin (LOVENOX) injection  40 mg Subcutaneous Q24H  . oseltamivir  30 mg Oral BID  . potassium chloride  40 mEq Oral BID   Continuous Infusions: . sodium chloride 100 mL/hr at 01/22/19 2050   PRN Meds: acetaminophen **OR** acetaminophen, polyethylene glycol  ================================================= ================================================= Subjective:  patient feels better today.  Still complains of cough, headache, and fatigue.  Wanted to know when she would go home.   Objective: Temp:  [98.3 F (36.8 C)-103.2 F (39.6 C)] 98.3 F (36.8 C) (01/26 0509) Pulse  Rate:  [114-138] 114 (01/26 0509) Resp:  [16-20] 18 (01/26 0509) BP: (100-136)/(59-102) 100/59 (01/26 0509) SpO2:  [92 %-98 %] 94 % (01/26 0509) Weight:  [51 kg-54.2 kg] 54.2 kg (01/25 2037) Intake/Output No intake/output data recorded. Physical Exam:  Gen: NAD, alert, non-toxic, well-nourished, well-appearing, sitting comfortably  HEENT: Normocephaic, atraumatic. Clear conjuctiva, no scleral icterus and injection.  CV: Regular rate and rhythm. Radial pulses 2+ bilaterally. No bilateral lower extremity edema. Resp: some mild inspiratory crackles on the right side. No increased work of breathing appreciated. Abd: Nontender and nondistended on palpation to all 4 quadrants.  Positive bowel sounds. Psych: Cooperative with exam. Pleasant. Makes eye contact.   Laboratory: Recent Labs  Lab 01/22/19 1458 01/23/19 0008  WBC 7.9 8.0  HGB 13.2 13.0  HCT 40.7 38.4  PLT 233 222   Recent Labs  Lab 01/22/19 1458 01/23/19 0008  NA 138 140  K 3.2* 3.4*  CL 99 106  CO2 27 26  BUN 8 7  CREATININE 0.82 0.67  CALCIUM 9.3 8.5*  PROT 8.1  --   BILITOT 1.6*  --   ALKPHOS 71  --   ALT 26  --   AST 25  --   GLUCOSE 122* 156*     Imaging/Diagnostic Tests: Dg Chest 2 View  Result Date: 01/22/2019 CLINICAL DATA:  Sore throat for 2 days EXAM: CHEST - 2 VIEW COMPARISON:  02/25/2017 FINDINGS: Cardiac shadows within normal limits. The lungs are well aerated bilaterally. Minimal right basilar atelectasis is seen. No sizable effusion is noted. No bony abnormality is noted. IMPRESSION: Minimal right basilar atelectasis. Electronically Signed   By: Linus Mako.D.  On: 01/22/2019 15:37      Benay Pike, MD 01/23/2019, 6:55 AM PGY-1, [place] Intern pager: [phone], text pages welcome

## 2019-01-24 LAB — BASIC METABOLIC PANEL
Anion gap: 10 (ref 5–15)
BUN: 13 mg/dL (ref 6–20)
CO2: 25 mmol/L (ref 22–32)
Calcium: 8.5 mg/dL — ABNORMAL LOW (ref 8.9–10.3)
Chloride: 102 mmol/L (ref 98–111)
Creatinine, Ser: 0.9 mg/dL (ref 0.44–1.00)
GFR calc non Af Amer: >60 mL/min (ref >60)
Glucose, Bld: 103 mg/dL — ABNORMAL HIGH (ref 70–99)
Potassium: 3.3 mmol/L — ABNORMAL LOW (ref 3.5–5.1)
Sodium: 137 mmol/L (ref 135–145)

## 2019-01-24 LAB — URINE CULTURE: CULTURE: NO GROWTH

## 2019-01-24 LAB — CBC
HCT: 36.1 % (ref 36.0–46.0)
Hemoglobin: 11.6 g/dL — ABNORMAL LOW (ref 12.0–15.0)
MCH: 29.1 pg (ref 26.0–34.0)
MCHC: 32.1 g/dL (ref 30.0–36.0)
MCV: 90.5 fL (ref 80.0–100.0)
NRBC: 0 % (ref 0.0–0.2)
Platelets: 170 10*3/uL (ref 150–400)
RBC: 3.99 MIL/uL (ref 3.87–5.11)
RDW: 12 % (ref 11.5–15.5)
WBC: 4.2 10*3/uL (ref 4.0–10.5)

## 2019-01-24 LAB — MAGNESIUM: Magnesium: 2.4 mg/dL (ref 1.7–2.4)

## 2019-01-24 MED ORDER — POTASSIUM CHLORIDE CRYS ER 20 MEQ PO TBCR
40.0000 meq | EXTENDED_RELEASE_TABLET | Freq: Two times a day (BID) | ORAL | Status: DC
  Administered 2019-01-24: 40 meq via ORAL
  Filled 2019-01-24: qty 2

## 2019-01-24 MED ORDER — POTASSIUM CHLORIDE CRYS ER 20 MEQ PO TBCR: 40.0000 meq | EXTENDED_RELEASE_TABLET | Freq: Every day | ORAL | 0 refills | Status: DC

## 2019-01-24 MED ORDER — OSELTAMIVIR PHOSPHATE 30 MG PO CAPS: 30.0000 mg | ORAL_CAPSULE | Freq: Two times a day (BID) | ORAL | 0 refills | Status: AC

## 2019-01-24 MED ORDER — SODIUM CHLORIDE 0.9 % IV BOLUS
1000.0000 mL | Freq: Once | INTRAVENOUS | Status: AC
  Administered 2019-01-24: 1000 mL via INTRAVENOUS

## 2019-01-24 NOTE — Progress Notes (Signed)
Given discharge paperwork in [place]. Discussed discharge education via iPad interpreter, pt given opportunity to ask questions. Pt educated that prescriptions had been sent to the pharmacy, verbalized understanding.   IV removed. Telemetry removed, CCMD notified.   Pt's daughter will be picking her up, pt has called her and she is on her way to the pt's room.   Fritz Pickerel, RN

## 2019-01-24 NOTE — Discharge Instructions (Signed)
B?nh cu?m, Ng???i l??n Influenza, Adult B?nh cu?m, th???ng hay ????c go?i la? "cu?m," la? m?t b?nh nhi?m vi ru?t chu? y?u a?nh h???ng ??n ????ng h h?p. ????ng h h?p bao g?m ca?c c? quan giu?p quy? vi? th??, ch??ng ha?n nh? ph?i, mu?i va? ho?ng. Cu?m gy ra nhi?u tri?u ch??ng t??ng t? ca?m la?nh thng th??ng cng v?i s?t cao va? ?au nh?c c? th?. Cu?m d? dng ly t? ng??i ny sang ng??i khc (d? ly). Tim pho?ng cu?m (tim v??c xin cu?m) m?i n?m la? ca?ch t?t nh?t ?? ng?n ng?a cu?m. Nguyn nhn g gy ra? Tnh tr?ng ny do vi rt cm gy ra. Quy? vi? co? th? nhi?m vi ru?t do:  Ht ph?i nh?ng gi?t n??c b?t trong khng kh t? ng??i b? nhi?m b?nh ho ho?c h?t h?i vo khng kh.  Ch?m vo v?t g ? ? ti?p xc v?i vi rt (? b? nhi?m b?n) v sau ? ch?m vo mi?ng, m?i, ho?c m?t c?a mnh. ?i?u g lm t?ng nguy c?? Nh?ng y?u t? sau c th? lm qu v? d? b? cu?m h?n:  Khng r??a ho??c st trng tay th??ng xuyn.  Ti?p xu?c g?n gu?i v??i nhi?u ng???i trong mu?a la?nh va? ma ca?m cu?m.  Cha?m va?o mi?ng, m??t ho??c mu?i ma? khng r??a ho??c sa?t tru?ng tay tr???c.  Khng tim pho?ng cu?m m?i n?m (ha?ng n?m). Quy? vi? co? th? co? nguy c? cao h?n b? b?nh cm, bao g?m cc v?n ?? nghim tr?ng, ch??ng ha?n nh? nhi?m trng ph?i (vim ph?i), n?u qu v?:  Trn 65 tu?i.  Co? [place].  Co? h? th?ng pho?ng ch?ng b?nh t?t (h? mi?n di?ch) y?u. Quy? vi? c h? mi?n d?ch b? suy y?u n?u quy? vi?: ? B? nhi?m HIV ho?c AIDS. ? ?ang ????c ho?a tri?. ? ?ang du?ng ca?c loa?i thu?c la?m gia?m (?c ch?) hoa?t ??ng c?a h? mi?n di?ch.  Co? b?nh ko da?i (ma?n ti?nh), ch??ng ha?n nh? b?nh tim, b?nh th?n, ti?u ????ng ho??c b?nh ph?i.  Bi? m?t b?nh l ? gan.  Qu v? th?a cn nghim tr?ng (bo ph b?nh l).  Bi? thi?u ma?u. ?y l tnh tr?ng ?nh h??ng ??n h?ng c?u c?a qu v?.  B? hen suy?n. Cc d?u hi?u ho?c tri?u ch?ng l g? Ca?c tri?u ch??ng cu?a tnh tr?ng na?y  th???ng b?t ??u ??t ng?t v ke?o da?i 4-14 nga?y. Cc ki?m tra ? c th? bao g?m:  S?t v ?n l?nh.  ?au ??u, ?au nh?c ton thn, ho?c ?au nh?c c?.  ?au h?ng.  Ho.  Ch?y n??c m?i ho?c ng?t (ngh?t) m?i.  C?m gic kh ch?u ? ng?c.  ?n khng ngon.  M?t m?i v ki?t s??c.  Chng m?t.  Bu?n nn ho?c nn. Ch?n ?on tnh tr?ng ny nh? th? no? Tnh tr?ng ny c th? ???c ch?n ?on d?a vo:  Tri?u ch?ng v b?nh s? c?a qu v?.  Khm th?c th?.  Dng t?m bng th?m d?ch m?i ho?c h?ng c?a qu v? v xt nghi?m d?ch xem c vi rt cm hay khng. Tnh tr?ng ny ???c ?i?u tr? nh? th? no? N?u b?nh cm ???c ch?n ?on s?m, qu v? c th? ???c ?i?u tr? b?ng thu?c c th? gip gi?m m?c ?? n?ng c?a b?nh v th?i gian b? b?nh (thu?c khng vi rt). Thu?c c th? ???c cho dng qua ???ng mi?ng (???ng u?ng) ho?c qua ???ng t?nh m?ch (IV). T? ch?m [place] b?n thn ? nh c th? gip lm gi?m b?t cc tri?u ch?ng. Chuyn gia  ch?m [place] s?c kh?e c?a qu v? c th? khuy?n ngh?:  Dng thu?c khng k ??n.  U?ng nhi?u n??c. Trong nhi?u tr??ng h?p, b?nh cm t? kh?i. N?u qu v? c tri?u ch?ng ho?c bi?n ch?ng n?ng, qu v? c th? ???c ?i?u tr? ? b?nh vi?n. Tun th? nh?ng h??ng d?n ny ? nh: Ho?t ??ng  Ngh? ng?i khi c?n v ng? th?t nhi?u.  Nghi? la?m ho??c nghi? ho?c theo chi? d?n cu?a chuyn gia ch?m so?c s??c kho?e. Tr?? khi quy? vi? ??n g?p chuyn gia ch?m so?c s??c kho?e, ha?y c? g??ng khng r??i kh?i nha? cho ??n khi ? h?t s?t trong 24 gi?? m khng du?ng thu?c. ?n v u?ng  U?ng m?t dung di?ch bu? n???c (ORS). ?y la? m?t lo?i n??c u?ng c ba?n ta?i nh thu?c va? ca?c c??a ha?ng ba?n le?Marland Kitchen  U?ng ?? n??c ?? gi? cho n??c ti?u c mu vng nh?t.  U?ng m?t l???ng nho? ca?c ?? lo?ng trong n?u quy? vi? co? th?. Cc ?? lo?ng trong bao g?m n???c, ?a? ba?o, n???c e?p tra?i cy loa?ng va? ca?c ?? u?ng th? thao i?t calo.  ?n l???ng nho? th??c ?n nha?t, d? tiu ha khi quy? vi? co? th?. Nh??ng th??c ?n na?y  bao g?m chu?i, n???c ta?o, c?m, thi?t na?c, ba?nh mi? n???ng va? ba?nh quy.  Tra?nh u?ng ca?c ?? lo?ng co? ch??a nhi?u ????ng ho??c caffeine, ch??ng ha?n nh? th?c u?ng n?ng l???ng, ?? u?ng th? thao thng th??ng va? n???c s ?a.  Trnh u?ng r??u.  Hessie Diener th?c ?n cay va? nhi?u ch?t be?o. H??ng d?n chung      Ch? s? d?ng thu?c khng k ??n v thu?c k ??n theo ch? d?n c?a chuyn gia ch?m [place] s?c kh?e.  S?? du?ng m?t ma?y ta?o s??ng mu? ma?t ?? lm t?ng ?? ?m cho khng khi? trong nha? quy? vi?. ?i?u ny c th? gip d? th? h?n.  Che mi?ng v m?i khi qu v? ho ho?c h?t h?i.  Th??ng xuyn r??a tay quy? vi? b??ng xa? pho?ng va? n???c, ??c bi?t l sau khi quy? vi? ho ho??c h??t h?i. N?u khng c s?n x phng v n??c, hy s? d?ng thu?c st trng tay c c?n.  Tun th? t?t c? cc l?n khm theo di theo ch? d?n c?a chuyn gia ch?m [place] s?c kh?e. ?i?u ny c vai tr quan tr?ng. Ng?n ng?a tnh tr?ng ny b?ng cch no?   Tim phng cm hng n?m. Quy? vi? co? th? ????c tim pho?ng cu?m va?o cu?i mu?a he?, mu?a thu ho??c mu?a ?ng. Hy h?i chuyn gia ch?m [place] s?c kh?e ?? bi?t khi no qu v? c?n tim pho?ng cu?m.  Tra?nh ti?p xu?c v??i nh??ng ng???i ?ang bi? ?m trong mu?a la?nh va? ma cu?m. Vi?c ny th??ng l vo ma thu v ma ?ng. Hy lin l?c v?i chuyn gia ch?m [place] s?c kh?e n?u:  Qu v? c cc tri?u ch?ng m?i.  Qu v? bi?: ? ?au ng?c. ? Tiu ch?y. ? S?t.  Ho tr?m tr?ng h?n.  Quy? vi? ho ra nhi?u di?ch nh?y h?n.  Qu v? c?m th?y bu?n nn ho?c qu v? nn. Yu c?u tr? gip ngay l?p t?c n?u:  Quy? vi? bi? th? d?c ho?c kh th?.  Da ho??c cc mng c?a quy? vi? chuy?n sang ma?u h?i xanh.  Qu v? b? ?au c? d? d?i ho?c c?ng c?.  Qu v? ??t nhin b? ?au ??u, ho?c ??t nhin bi? ?au ? m?t ho?c ? tai.  Qu v?  khng th? ?n ho?c u?ng m khng b? nn. Tm t?t  B?nh cu?m, th???ng hay ????c go?i la? "cu?m," la? m?t b?nh nhi?m vi ru?t chu? y?u a?nh h???ng ??n ????ng h h?p  c?a qu v?.  Cc tri?u ch?ng cm th??ng b?t ??u ??t ng?t v ko di 4-14 ngy.  Tim pho?ng cu?m ha?ng n?m la? ca?ch t?t nh?t ?? pho?ng tra?nh b? b?nh cu?m.  Nghi? la?m ho??c nghi? ho?c theo chi? d?n cu?a chuyn gia ch?m so?c s??c kho?e. Tr?? khi quy? vi? ??n g?p chuyn gia ch?m so?c s??c kho?e, ha?y c? g??ng khng r??i kh?i nha? cho ??n khi ? h?t s?t trong 24 gi?? m khng du?ng thu?c.  Tun th? t?t c? cc l?n khm theo di theo ch? d?n c?a chuyn gia ch?m [place] s?c kh?e. ?i?u ny c vai tr quan tr?ng. Thng tin ny khng nh?m m?c ?ch thay th? cho l?i khuyn m chuyn gia ch?m [place] s?c kh?e ni v?i qu v?. Hy b?o ??m qu v? ph?i th?o lu?n b?t k? v?n ?? g m qu v? c v?i chuyn gia ch?m [place] s?c kh?e c?a qu v?. Document Released: 12/15/2005 Document Revised: 07/19/2018 Document Reviewed: 07/19/2018 Elsevier Interactive Patient Education  2019 [employer].  Oseltamivir capsules ?y l thu?c g? OSELTAMIVIR l thu?c khng virus. N ???c dng ?? phng ng?a v ?i?u tr? m?t s? b?nh cm. N khng c tc d?ng ??i v?i b?nh c?m l?nh ho?c cc b?nh nhi?m virus khc. Thu?c ny c th? ???c dng cho nh?ng m?c ?ch khc; hy h?i ng??i cung c?p d?ch v? y t? ho?c d??c s? c?a mnh, n?u qu v? c th?c m?c. (CC) NHN HI?U PH? BI?N: Tamiflu Ti c?n ph?i bo cho ng??i cung c?p d?ch v? y t? c?a mnh ?i?u g tr??c khi dng thu?c ny? H? c?n bi?t li?u qu v? c b?t k? tnh tr?ng no sau ?y khng: -b?nh tim -cc v?n ?? v? h? mi?n d?ch -b?nh th?n -b?nh gan -b?nh ph?i -ph?n ?ng b?t th??ng ho?c d? ?ng v?i oseltamivir -pha?n ??ng b?t th???ng ho??c di? ??ng v??i ca?c d??c ph?m kha?c -pha?n ??ng b?t th???ng ho??c di? ??ng v??i th??c ph?m, thu?c nhu?m, ho??c ch?t ba?o qua?n -?ang c thai ho??c ??nh co? thai -?ang cho con bu? Ti nn s? d?ng thu?c ny nh? th? no? U?ng thu?c ny v?i m?t ly n??c. Hy lm theo cc h??ng d?n trn h?p thu?c ho?c nhn thu?c. B?t ??u dng thu?c ny khi c d?u hi?u  ??u tin c?a cc tri?u ch?ng cm. Qu v? c th? u?ng thu?c ny cng ho?c khng cng v?i th?c ?n. N?u thu?c lm kh ch?u bao t? qu v?, th hy u?ng thu?c cng v?i th?c ?n. Dng thu?c ny vo nh?ng kho?ng th?i gian ??u nhau. Khng ???c dng thu?c ny nhi?u l?n h?n ? ???c ch? d?n. Hy hon t?t ton b? ??t thu?c nh? ? ???c ch? d?n, ngay c? khi qu v? ngh? r?ng tnh tr?ng c?a mnh ? kh h?n. Khng ???c b? qua cc li?u thu?c ho?c ng?ng thu?c ny s?m. Hy bn v?i bc s? nhi khoa c?a qu v? v? vi?c dng thu?c ny ? tr? em. Thu?c ny c th? ???c k toa cho tr? em ch? m?i 14 nga?y tu?i trong nh?ng tr??ng h?p ch?n l?c, nh?ng c?n ph?i th?n tr?ng. Qu li?u: N?u qu v? cho r?ng mnh ? dng qu nhi?u thu?c ny, th hy lin l?c v?i trung tm ki?m sot ch?t ??c ho?c phng c?p c?u ngay l?p t?c.  L?U : Thu?c ny ch? dnh ring cho qu v?. Khng chia s? thu?c ny v?i nh?ng ng??i khc. N?u ti l? qun m?t li?u th sao? N?u l? qun m?t li?u thu?c, hy dng li?u ? ngay khi qu v? s?c nh? ra. N?u h?u nh? ? ??n gi? dng li?u thu?c k? ti?p (trong vng 2 gi? ??ng h?), th hy dng ch? li?u k? ti?p ? m thi. Khng ???c dng li?u g?p ?i ho?c dng thm li?u. Nh?ng g c th? t??ng tc v?i thu?c ny? -thu?c ch?ng ng?a cm dng ? trong m?i Danh sch ny c th? khng m t? ?? h?t cc t??ng tc c th? x?y ra. Hy ??a cho ng??i cung c?p d?ch v? y t? c?a mnh danh sch t?t c? cc thu?c, th?o d??c, cc thu?c khng c?n toa, ho?c cc ch? ph?m b? sung m qu v? dng. C?ng nn bo cho h? bi?t r?ng qu v? c ht thu?c, u?ng r??u, ho?c c s? d?ng ma ty tri php hay khng. Vi th? c th? t??ng tc v?i thu?c c?a qu v?. Ti c?n ph?i theo di ?i?u g trong khi dng thu?c ny? Hy ??n g?p bc s? ho?c [place] vin y t? ?? theo di ??nh k? s?c kh?e c?a mnh. Hy bo cho bc s? ho?c chuyn vin y t?, n?u cc tri?u ch?ng c?a qu v? khng kh h?n, ho?c tr? nn n?ng h?n. N?u qu v? b? cm, qu v? c th? c nguy c? cao b? co gi?t, b? l l?n, ho?c  c hnh vi b?t th??ng. ?i?u ny x?y ra s?m trong qu trnh b?nh, v th??ng g?p h?n ? tr? em v thanh thi?u nin. Nh?ng bi?n c? ny khng ph? bi?n, nh?ng c th? d?n ??n th??ng tch do tai n?n cho b?nh nhn. Gia ?nh v ng??i ch?m [place] c?a b?nh nhn nn theo di cc d?u hi?u c?a hnh vi b?t th??ng, v lin l?c ngay v?i bc s? ho?c chuyn vin y t?, n?u b?nh nhn bi?u l? cc d?u hi?u c?a hnh vi b?t th??ng. Ch? ph?m ny khng ph?i l thu?c thay th? cho chch ng?a cm. M?i n?m, hy bn v?i bc s? v? vi?c chch ng?a cm th??ng nin. Ti c th? nh?n th?y nh?ng tc d?ng ph? no khi dng thu?c ny? Nh?ng tc d?ng ph? qu v? c?n ph?i bo cho bc s? ho?c chuyn vin y t? cng s?m cng t?t: -cc ph?n ?ng d? ?ng, ch?ng h?n nh? da b? m?n ??, ng?a, n?i my ?ay, s?ng ? m?t, mi, ho?c l??i -lo u, l l?n, hnh vi b?t th??ng -kh th? -?o gic, m?t lin h? v?i th?c t?i -m?n ??, r?p da, bong ho?c trc da, bao g?m bn trong mi?ng. -co gi?t (kinh phong) Cc tc d?ng ph? khng c?n ph?i ch?m [place] y t? (hy bo cho bc s? ho?c chuyn vin y t?, n?u cc tc d?ng ph? ny ti?p di?n ho?c gy phi?n toi): -tiu ch?y -?au ??u -bu?n i ho?c i m?a -?au Danh sch ny c th? khng m t? ?? h?t cc tc d?ng ph? c th? x?y ra. Xin g?i t?i bc s? c?a mnh ?? ???c c? v?n chuyn mn v? cc tc d?ng ph?Sander Nephew v? c th? t??ng trnh cc tc d?ng ph? cho FDA theo s? [phone]. Ti nn c?t gi? thu?c c?a mnh ? ?u? ?? ngoi t?m tay tr? em. C?t gi? ? nhi?t ?? phng t? 15 ??n 30 ?? C (59 ??n 86 ?? F). V?t b? t?t  c? thu?c ch?a dng sau ngy h?t h?n in trn nhn thu?c ho?c bao thu?c. L?U : ?y l b?n tm t?t. N c th? khng bao hm t?t c? thng tin c th? c. N?u qu v? th?c m?c v? thu?c ny, xin trao ??i v?i bc s?, d??c s?, ho?c ng??i cung c?p d?ch v? y t? c?a mnh.  2019 Elsevier/Gold Standard (2017-07-23 00:00:00)

## 2019-01-25 NOTE — ED Provider Notes (Signed)
[facility] Provider Note   CSN: [id number] Arrival date & time: 01/22/19  1438     History   Chief Complaint Chief Complaint  Patient presents with  . Fever  . Cough  . Sore Throat    HPI Glenda Pham is a 58 y.o. female.  Patient with 2-day history of sore throat productive cough fever.  Patient reports that family has similar symptoms for the past week.  Including her husband.  Patient has not traveled outside the night states.  Her daughter returned just 2 days ago from [place].  But family members were already sick and patient was already sick at that time.     Past Medical History:  Diagnosis Date  . Depression   . Headache   . Hypertension     Patient Active Problem List   Diagnosis Date Noted  . Tachycardia   . Essential hypertension   . Influenza A 01/22/2019  . Chronic migraine 11/27/2016    Past Surgical History:  Procedure Laterality Date  . HAND SURGERY Right   . TUMOR REMOVAL     Left shoulder - benign     OB History   No obstetric history on file.      Home Medications    Prior to Admission medications   Medication Sig Start Date End Date Taking? Authorizing Provider  acetaminophen (TYLENOL) 325 MG tablet Take 325 mg by mouth daily as needed for mild pain.   Yes [provider]  amLODipine (NORVASC) 5 MG tablet Take 5 mg by mouth daily. 12/28/18  Yes [provider]  meclizine (ANTIVERT) 12.5 MG tablet Take 12.5 mg by mouth 3 (three) times daily as needed for dizziness or nausea. 12/27/18  Yes [provider]  oseltamivir (TAMIFLU) 30 MG capsule Take 1 capsule (30 mg total) by mouth 2 (two) times daily for 4 days. 01/24/19 01/28/19  Benay Pike, MD  potassium chloride SA (K-DUR,KLOR-CON) 20 MEQ tablet Take 2 tablets (40 mEq total) by mouth daily for 7 days. 01/24/19 01/31/19  Benay Pike, MD    Family History Family History  Problem Relation Age of Onset  . Heart attack Mother   .  Heart attack Father     Social History Social History   Tobacco Use  . Smoking status: Never Smoker  . Smokeless tobacco: Never Used  Substance Use Topics  . Alcohol use: No  . Drug use: No     Allergies   Fluoxetine hcl and Vancomycin   Review of Systems Review of Systems  Constitutional: Positive for fever. Negative for chills.  HENT: Positive for sore throat. Negative for rhinorrhea.   Eyes: Negative for visual disturbance.  Respiratory: Positive for cough and shortness of breath.   Cardiovascular: Positive for palpitations. Negative for chest pain and leg swelling.  Gastrointestinal: Negative for abdominal pain, diarrhea, nausea and vomiting.  Genitourinary: Negative for dysuria.  Musculoskeletal: Negative for back pain and neck pain.  Skin: Negative for rash.  Neurological: Negative for dizziness, light-headedness and headaches.  Hematological: Does not bruise/bleed easily.  Psychiatric/Behavioral: Negative for confusion.     Physical Exam Updated Vital Signs BP 104/77   Pulse 96   Temp 98 F (36.7 C)   Resp 14   Ht 1.45 m (4' 9.09")   Wt 54.2 kg   LMP 10/08/2014   SpO2 100%   BMI 25.78 kg/m   Physical Exam Vitals signs and nursing note reviewed.  Constitutional:  General: She is in acute distress.     Appearance: She is well-developed. She is ill-appearing and toxic-appearing.  HENT:     Head: Normocephalic and atraumatic.     Mouth/Throat:     Mouth: Mucous membranes are dry.  Eyes:     Extraocular Movements: Extraocular movements intact.     Conjunctiva/sclera: Conjunctivae normal.     Pupils: Pupils are equal, round, and reactive to light.  Neck:     Musculoskeletal: Normal range of motion and neck supple.  Cardiovascular:     Rate and Rhythm: Regular rhythm. Tachycardia present.     Heart sounds: No murmur.  Pulmonary:     Effort: Pulmonary effort is normal. No respiratory distress.     Breath sounds: Normal breath sounds. No  wheezing.  Abdominal:     General: Bowel sounds are normal.     Palpations: Abdomen is soft.     Tenderness: There is no abdominal tenderness.  Musculoskeletal: Normal range of motion.        General: No swelling.  Skin:    General: Skin is warm and dry.     Capillary Refill: Capillary refill takes less than 2 seconds.  Neurological:     General: No focal deficit present.     Mental Status: She is alert and oriented to person, place, and time.      ED Treatments / Results  Labs (all labs ordered are listed, but only abnormal results are displayed) Labs Reviewed  COMPREHENSIVE METABOLIC PANEL - Abnormal; Notable for the following components:      Result Value   Potassium 3.2 (*)    Glucose, Bld 122 (*)    Total Bilirubin 1.6 (*)    All other components within normal limits  URINALYSIS, ROUTINE W REFLEX MICROSCOPIC - Abnormal; Notable for the following components:   Color, Urine STRAW (*)    All other components within normal limits  INFLUENZA PANEL BY PCR (TYPE A & B) - Abnormal; Notable for the following components:   Influenza A By PCR POSITIVE (*)    All other components within normal limits  BASIC METABOLIC PANEL - Abnormal; Notable for the following components:   Potassium 3.4 (*)    Glucose, Bld 156 (*)    Calcium 8.5 (*)    All other components within normal limits  BASIC METABOLIC PANEL - Abnormal; Notable for the following components:   Potassium 3.3 (*)    Glucose, Bld 103 (*)    Calcium 8.5 (*)    All other components within normal limits  CBC - Abnormal; Notable for the following components:   Hemoglobin 11.6 (*)    All other components within normal limits  CULTURE, BLOOD (ROUTINE X 2)  CULTURE, BLOOD (ROUTINE X 2)  GROUP A STREP BY PCR  URINE CULTURE  LACTIC ACID, PLASMA  LACTIC ACID, PLASMA  CBC WITH DIFFERENTIAL/PLATELET  PROTIME-INR  HIV ANTIBODY (ROUTINE TESTING W REFLEX)  CBC  MAGNESIUM  I-STAT BETA HCG BLOOD, ED ([facility], [facility], [facility] ONLY)     EKG EKG Interpretation  Date/Time:  Saturday January 22 2019 17:38:45 EST Ventricular Rate:  121 PR Interval:    QRS Duration: 75 QT Interval:  314 QTC Calculation: 446 R Axis:   86 Text Interpretation:  Sinus tachycardia Borderline T abnormalities, diffuse leads Baseline wander in lead(s) V5 Confirmed by Fredia Sorrow [phone]) on 01/22/2019 5:45:59 PM   Radiology No results found.  Procedures Procedures (including critical care time)  CRITICAL CARE Performed by: Nicki Reaper  Krishiv Sandler Total critical care time: 30 minutes Critical care time was exclusive of separately billable procedures and treating other patients. Critical care was necessary to treat or prevent imminent or life-threatening deterioration. Critical care was time spent personally by me on the following activities: development of treatment plan with patient and/or surrogate as well as nursing, discussions with consultants, evaluation of patient's response to treatment, examination of patient, obtaining history from patient or surrogate, ordering and performing treatments and interventions, ordering and review of laboratory studies, ordering and review of radiographic studies, pulse oximetry and re-evaluation of patient's condition.   Medications Ordered in ED Medications  sodium chloride flush (NS) 0.9 % injection 3 mL (3 mLs Intravenous Given 01/22/19 1846)  acetaminophen (TYLENOL) tablet 650 mg (650 mg Oral Given 01/22/19 1546)  sodium chloride 0.9 % bolus 500 mL (0 mLs Intravenous Stopped 01/22/19 1952)  ceFEPIme (MAXIPIME) 2 g in sodium chloride 0.9 % 100 mL IVPB (0 g Intravenous Stopped 01/22/19 1656)  vancomycin (VANCOCIN) IVPB 1000 mg/200 mL premix (0 mg Intravenous Stopped 01/22/19 2055)  sodium chloride 0.9 % bolus 1,000 mL (0 mLs Intravenous Stopped 01/22/19 2001)  oseltamivir (TAMIFLU) capsule 75 mg (75 mg Oral Given 01/22/19 2015)  potassium chloride SA (K-DUR,KLOR-CON) CR tablet 40 mEq (40 mEq Oral Given  01/23/19 0847)  sodium chloride 0.9 % bolus 500 mL (0 mLs Intravenous Stopped 01/23/19 0100)  sodium chloride 0.9 % bolus 1,000 mL (1,000 mLs Intravenous New Bag/Given 01/24/19 1135)     Initial Impression / Assessment and Plan / ED Course  I have reviewed the triage vital signs and the nursing notes.  Pertinent labs & imaging results that were available during my care of the patient were reviewed by me and considered in my medical decision making (see chart for details).     Patient lives here is of [place].  No traveling outside the country.  Has family members at home with similar symptoms none of them had been out of the country.  Patient tested positive for influenza A her symptoms been ongoing for 2 days.  But she remained tachycardic despite significant fluid hydration.  Discussed with hospitalist they will admit.  Patient started on Tamiflu.  Urinalysis negative for urinary tract infection.  Patient did not meet septic criteria for full fluid resuscitation.  Lactic acid was 1.4.  Her group A strep was negative.  Blood cultures were done and pending.  Chest x-ray negative for pneumonia.  Patient's vital signs on presentation triggered sepsis work-up.  Temp was 103.  Patient was tachycardic 125.  Was not hypotensive and never was hypotensive.  Lactic acids were less than 4.  Patient never required full fluid challenge.  Final Clinical Impressions(s) / ED Diagnoses   Final diagnoses:  Influenza A  Tachycardia    ED Discharge Orders         Ordered    oseltamivir (TAMIFLU) 30 MG capsule  2 times daily     01/24/19 1445    potassium chloride SA (K-DUR,KLOR-CON) 20 MEQ tablet  Daily     01/24/19 1445    Increase activity slowly     01/24/19 1445    Diet - low sodium heart healthy     01/24/19 1445    Call MD for:  difficulty breathing, headache or visual disturbances     01/24/19 1445           Fredia Sorrow, MD 01/25/19 [phone]

## 2019-01-27 LAB — CULTURE, BLOOD (ROUTINE X 2)
Culture: NO GROWTH
Culture: NO GROWTH
SPECIAL REQUESTS: ADEQUATE
Special Requests: ADEQUATE

## 2019-02-16 ENCOUNTER — Encounter (HOSPITAL_COMMUNITY): Payer: Self-pay | Admitting: Emergency Medicine

## 2019-02-16 ENCOUNTER — Ambulatory Visit (HOSPITAL_COMMUNITY)
Admission: EM | Admit: 2019-02-16 | Discharge: 2019-02-16 | Disposition: A | Discharge status: Home / Self Care | Payer: BLUE CROSS/BLUE SHIELD | Attending: Family Medicine | Admitting: Family Medicine

## 2019-02-16 ENCOUNTER — Ambulatory Visit (INDEPENDENT_AMBULATORY_CARE_PROVIDER_SITE_OTHER): Payer: BLUE CROSS/BLUE SHIELD

## 2019-02-16 DIAGNOSIS — R0602 Shortness of breath: Secondary | ICD-10-CM | POA: Insufficient documentation

## 2019-02-16 DIAGNOSIS — R079 Chest pain, unspecified: Secondary | ICD-10-CM | POA: Diagnosis not present

## 2019-02-16 LAB — D-DIMER, QUANTITATIVE (NOT AT ARMC): D DIMER QUANT: 0.51 ug{FEU}/mL — AB (ref 0.00–0.50)

## 2019-02-16 NOTE — Discharge Instructions (Addendum)
Your chest x-ray and EKG were normal We are drawing a d-dimer to see if this is elevated if so you will need to go to the ER to rule out blood clot.

## 2019-02-16 NOTE — ED Triage Notes (Signed)
Pt presents to [facility] for assessment of central chest pain starting approx 2 weeks ago after she had a viral illness with fever and was seen here.  C/o SOB.  Also c/o intermittent nausea, denies emesis.  Denies syncope, c/o intermittent headaches.  C/o dizziness, intermittent as well.  C/o fatigue.

## 2019-02-17 NOTE — ED Provider Notes (Signed)
[place]    CSN: [id number] Arrival date & time: 02/16/19  4098     History   Chief Complaint Chief Complaint  Patient presents with  . Shortness of Breath    HPI Glenda Pham is a 58 y.o. female.   Patient is a 58 year old female who presents today for central chest pain started approximately 2 weeks ago after being diagnosed and treated for flu.  She has had persistent chest pain and shortness of breath since this started. She finished all of the medication that was prescribed for the flu illness. No cough currently.   She has also had some intermittent nausea but denies vomiting. No abdominal pain. Complains of headaches,  dizziness and fatigue at times.  All the symptoms seem to wax and wane.  Patient is sitting on exam table in no distress.   ROS per HPI      Past Medical History:  Diagnosis Date  . Depression   . Headache   . Hypertension     Patient Active Problem List   Diagnosis Date Noted  . Tachycardia   . Essential hypertension   . Influenza A 01/22/2019  . Chronic migraine 11/27/2016    Past Surgical History:  Procedure Laterality Date  . HAND SURGERY Right   . TUMOR REMOVAL     Left shoulder - benign    OB History   No obstetric history on file.      Home Medications    Prior to Admission medications   Medication Sig Start Date End Date Taking? Authorizing Provider  amLODipine (NORVASC) 5 MG tablet Take 5 mg by mouth daily. 12/28/18  Yes [provider]  acetaminophen (TYLENOL) 325 MG tablet Take 325 mg by mouth daily as needed for mild pain.    [provider]  meclizine (ANTIVERT) 12.5 MG tablet Take 12.5 mg by mouth 3 (three) times daily as needed for dizziness or nausea. 12/27/18   [provider]  potassium chloride SA (K-DUR,KLOR-CON) 20 MEQ tablet Take 2 tablets (40 mEq total) by mouth daily for 7 days. 01/24/19 01/31/19  Benay Pike, MD    Family History Family History  Problem Relation  Age of Onset  . Heart attack Mother   . Heart attack Father     Social History Social History   Tobacco Use  . Smoking status: Never Smoker  . Smokeless tobacco: Never Used  Substance Use Topics  . Alcohol use: No  . Drug use: No     Allergies   Fluoxetine hcl and Vancomycin   Review of Systems Review of Systems   Physical Exam Triage Vital Signs ED Triage Vitals  Enc Vitals Group     BP 02/16/19 0928 115/75     Pulse Rate 02/16/19 0928 96     Resp 02/16/19 0928 16     Temp 02/16/19 0928 97.9 F (36.6 C)     Temp Source 02/16/19 0928 Temporal     SpO2 02/16/19 0928 100 %     Weight --      Height --      Head Circumference --      Peak Flow --      Pain Score 02/16/19 0929 6     Pain Loc --      Pain Edu? --      Excl. in [place]? --    No data found.  Updated Vital Signs BP 115/75 (BP Location: Left Arm)   Pulse 96   Temp  97.9 F (36.6 C) (Temporal)   Resp 16   LMP 10/08/2014   SpO2 100%   Visual Acuity Right Eye Distance:   Left Eye Distance:   Bilateral Distance:    Right Eye Near:   Left Eye Near:    Bilateral Near:     Physical Exam Vitals signs and nursing note reviewed.  Constitutional:      General: She is not in acute distress.    Appearance: She is well-developed. She is not ill-appearing, toxic-appearing or diaphoretic.  HENT:     Head: Normocephalic and atraumatic.  Eyes:     Conjunctiva/sclera: Conjunctivae normal.  Neck:     Musculoskeletal: Normal range of motion and neck supple.  Cardiovascular:     Rate and Rhythm: Normal rate and regular rhythm.     Heart sounds: No murmur.  Pulmonary:     Effort: Pulmonary effort is normal. No respiratory distress.     Breath sounds: Normal breath sounds.  Abdominal:     Palpations: Abdomen is soft.     Tenderness: There is no abdominal tenderness.  Musculoskeletal: Normal range of motion.     Right lower leg: She exhibits no tenderness. No edema.     Left lower leg: She exhibits no  tenderness. No edema.  Skin:    General: Skin is warm and dry.  Neurological:     Mental Status: She is alert.  Psychiatric:        Mood and Affect: Mood normal.      UC Treatments / Results  Labs (all labs ordered are listed, but only abnormal results are displayed) Labs Reviewed  D-DIMER, QUANTITATIVE (NOT AT [facility]) - Abnormal; Notable for the following components:      Result Value   D-Dimer, Quant 0.51 (*)    All other components within normal limits    EKG None  Radiology Dg Chest 2 View  Result Date: 02/16/2019 CLINICAL DATA:  Chest pain and shortness of breath EXAM: CHEST - 2 VIEW COMPARISON:  January 22, 2019 FINDINGS: Lungs are clear. The heart size and pulmonary vascularity are normal. No adenopathy. No pneumothorax. No bone lesions. IMPRESSION: No edema or consolidation. Electronically Signed   By: Lowella Grip III M.D.   On: 02/16/2019 10:43    Procedures Procedures (including critical care time)  Medications Ordered in UC Medications - No data to display  Initial Impression / Assessment and Plan / UC Course  I have reviewed the triage vital signs and the nursing notes.  Pertinent labs & imaging results that were available during my care of the patient were reviewed by me and considered in my medical decision making (see chart for details).     Pt is a 58 year old that come in today with concerns of waxing an waning chest pain and SOB. She was seen and treated for the flu a few weeks back. Most of those symptoms have resolved but she has been left with this nagging chest pain and SOB.  She is sitting on the exam table in no distress.  Chest pain is not reproducible Her vitals are completely stable.  Chest x ray was normal and EKG normal sinus rhythm and normal rate.  There is no concern for ACS, CHF.  My only concerning differential would be PE but the d dime was normal. I am not considering .51 elevated.   I am not seeing anything that concerns me  enough to send her to the ER Spoke with the daughter  on the phone about recommendations.    The family is concerned because a few family members over the years have died suddenly in their sleep.  I reassured that I am not seeing anything concerning on the exam but if they are worried then they can go to the ER for further testing.  Pt and family agreed.  Strict return precautions and ER precautions given.    Final Clinical Impressions(s) / UC Diagnoses   Final diagnoses:  Chest pain, unspecified type  Shortness of breath     Discharge Instructions     Your chest x-ray and EKG were normal We are drawing a d-dimer to see if this is elevated if so you will need to go to the ER to rule out blood clot.    ED Prescriptions    None     Controlled Substance Prescriptions [place] Controlled Substance Registry consulted? Not Applicable   Orvan July, NP 02/17/19 1402

## 2020-01-21 ENCOUNTER — Other Ambulatory Visit: Payer: Self-pay

## 2020-01-21 ENCOUNTER — Ambulatory Visit (HOSPITAL_COMMUNITY)
Admission: EM | Admit: 2020-01-21 | Discharge: 2020-01-21 | Disposition: A | Discharge status: Home / Self Care | Payer: 59 | Attending: Family Medicine | Admitting: Family Medicine

## 2020-01-21 ENCOUNTER — Encounter (HOSPITAL_COMMUNITY): Payer: Self-pay

## 2020-01-21 DIAGNOSIS — Z20822 Contact with and (suspected) exposure to covid-19: Secondary | ICD-10-CM | POA: Diagnosis present

## 2020-01-21 DIAGNOSIS — E860 Dehydration: Secondary | ICD-10-CM | POA: Diagnosis present

## 2020-01-21 DIAGNOSIS — R519 Headache, unspecified: Secondary | ICD-10-CM | POA: Insufficient documentation

## 2020-01-21 DIAGNOSIS — R739 Hyperglycemia, unspecified: Secondary | ICD-10-CM | POA: Diagnosis not present

## 2020-01-21 LAB — COMPREHENSIVE METABOLIC PANEL
ALT: 42 U/L (ref 0–44)
AST: 40 U/L (ref 15–41)
Albumin: 4.2 g/dL (ref 3.5–5.0)
Alkaline Phosphatase: 72 U/L (ref 38–126)
Anion gap: 12 (ref 5–15)
BUN: 20 mg/dL (ref 6–20)
CO2: 23 mmol/L (ref 22–32)
Calcium: 9 mg/dL (ref 8.9–10.3)
Chloride: 100 mmol/L (ref 98–111)
Creatinine, Ser: 1.1 mg/dL — ABNORMAL HIGH (ref 0.44–1.00)
GFR calc Af Amer: >60 mL/min (ref >60)
GFR calc non Af Amer: 55 mL/min — ABNORMAL LOW (ref >60)
Glucose, Bld: 164 mg/dL — ABNORMAL HIGH (ref 70–99)
Potassium: 3.8 mmol/L (ref 3.5–5.1)
Sodium: 135 mmol/L (ref 135–145)
Total Bilirubin: 1.1 mg/dL (ref 0.3–1.2)
Total Protein: 8 g/dL (ref 6.5–8.1)

## 2020-01-21 LAB — CBC WITH DIFFERENTIAL/PLATELET
Abs Immature Granulocytes: 0.01 10*3/uL (ref 0.00–0.07)
Basophils Absolute: 0 10*3/uL (ref 0.0–0.1)
Basophils Relative: 1 %
Eosinophils Absolute: 0 10*3/uL (ref 0.0–0.5)
Eosinophils Relative: 0 %
HCT: 42.8 % (ref 36.0–46.0)
Hemoglobin: 14.3 g/dL (ref 12.0–15.0)
Immature Granulocytes: 0 %
Lymphocytes Relative: 27 %
Lymphs Abs: 1.3 10*3/uL (ref 0.7–4.0)
MCH: 29.7 pg (ref 26.0–34.0)
MCHC: 33.4 g/dL (ref 30.0–36.0)
MCV: 89 fL (ref 80.0–100.0)
Monocytes Absolute: 0.6 10*3/uL (ref 0.1–1.0)
Monocytes Relative: 13 %
Neutro Abs: 2.8 10*3/uL (ref 1.7–7.7)
Neutrophils Relative %: 59 %
Platelets: 209 10*3/uL (ref 150–400)
RBC: 4.81 MIL/uL (ref 3.87–5.11)
RDW: 11.9 % (ref 11.5–15.5)
WBC: 4.7 10*3/uL (ref 4.0–10.5)
nRBC: 0 % (ref 0.0–0.2)

## 2020-01-21 LAB — POC SARS CORONAVIRUS 2 AG -  ED: SARS Coronavirus 2 Ag: NEGATIVE

## 2020-01-21 LAB — GLUCOSE, CAPILLARY: Glucose-Capillary: 205 mg/dL — ABNORMAL HIGH (ref 70–99)

## 2020-01-21 LAB — CBG MONITORING, ED: Glucose-Capillary: 205 mg/dL — ABNORMAL HIGH (ref 70–99)

## 2020-01-21 LAB — POC SARS CORONAVIRUS 2 AG: SARS Coronavirus 2 Ag: NEGATIVE

## 2020-01-21 MED ORDER — SODIUM CHLORIDE 0.9 % IV BOLUS
1000.0000 mL | Freq: Once | INTRAVENOUS | Status: AC
  Administered 2020-01-21: 12:00:00 1000 mL via INTRAVENOUS

## 2020-01-21 MED ORDER — METFORMIN HCL 500 MG PO TABS: 500.0000 mg | ORAL_TABLET | Freq: Two times a day (BID) | ORAL | 0 refills | Status: AC

## 2020-01-21 NOTE — ED Triage Notes (Signed)
Pt presents with headache & dizziness X 5 days with no relief with OTC medication.

## 2020-01-21 NOTE — Discharge Instructions (Addendum)
Call to schedule an appointment with Dr. Carlota Raspberry at [facility] 01/23/20. You will need follow-up blood work and evaluation of diabetes given elevated blood sugar.  Starting you on low dose Metformin 500 mg twice daily with food only. If you are unable to eat food, hold dose of medication as medication must be taken with food. Continue blood pressure medication. Drink as much fluid as possible to prevent dehydration.  Your COVID 19 results will be available in 48-72 hours. Negative results are immediately resulted to Mychart. All positive results are communicated with a phone call from our office.

## 2020-01-21 NOTE — ED Notes (Signed)
IVF bolus complete, pt states feels better. Stable condition.

## 2020-01-21 NOTE — ED Provider Notes (Signed)
[place]    CSN: [id number] Arrival date & time: 01/21/20  1005      History   Chief Complaint Chief Complaint  Patient presents with  . Headache  . Dizziness    HPI Glenda Pham is a 59 y.o. female.  Daughter and son provided interpretation during visit.  HPI  [place] with headache dizziness and headaches. She is tachycardic on arrival. Medical history significant for hypertension and prior BMP indicates elevated glucose although patient is not being treated or diagnosed with diabetes. She has not seen her PCP since 2018 and gets medication from either urgent care or ER.  She presents for generalized body aches, poor appetite and poor fluid intake. She has experienced subjective fever for 2 weeks. No known sick contacts or exposure to individuals positive for COVID-19. Endorses dizziness, generalized headache, and fatigue. Past Medical History:  Diagnosis Date  . Depression   . Headache   . Hypertension     Patient Active Problem List   Diagnosis Date Noted  . Tachycardia   . Essential hypertension   . Influenza A 01/22/2019  . Chronic migraine 11/27/2016    Past Surgical History:  Procedure Laterality Date  . HAND SURGERY Right   . TUMOR REMOVAL     Left shoulder - benign    OB History   No obstetric history on file.      Home Medications    Prior to Admission medications   Medication Sig Start Date End Date Taking? Authorizing Provider  acetaminophen (TYLENOL) 325 MG tablet Take 325 mg by mouth daily as needed for mild pain.    [provider]  amLODipine (NORVASC) 5 MG tablet Take 5 mg by mouth daily. 12/28/18   [provider]  meclizine (ANTIVERT) 12.5 MG tablet Take 12.5 mg by mouth 3 (three) times daily as needed for dizziness or nausea. 12/27/18   [provider]  potassium chloride SA (K-DUR,KLOR-CON) 20 MEQ tablet Take 2 tablets (40 mEq total) by mouth daily for 7 days. 01/24/19 01/31/19  Benay Pike, MD    Family History Family History  Problem Relation Age of Onset  . Heart attack Mother   . Heart attack Father     Social History Social History   Tobacco Use  . Smoking status: Never Smoker  . Smokeless tobacco: Never Used  Substance Use Topics  . Alcohol use: No  . Drug use: No     Allergies   Fluoxetine hcl and Vancomycin   Review of Systems Review of Systems Pertinent negatives listed in HPI  Physical Exam Triage Vital Signs ED Triage Vitals  Enc Vitals Group     BP 01/21/20 1033 107/76     Pulse Rate 01/21/20 1033 (!) 130     Resp 01/21/20 1033 20     Temp 01/21/20 1033 99.4 F (37.4 C)     Temp Source 01/21/20 1033 Oral     SpO2 01/21/20 1033 96 %     Weight --      Height --      Head Circumference --      Peak Flow --      Pain Score 01/21/20 1036 5     Pain Loc --      Pain Edu? --      Excl. in [place]? --    No data found.  Updated Vital Signs BP 107/76 (BP Location: Right Arm)   Pulse (!) 130   Temp  99.4 F (37.4 C) (Oral)   Resp 20   LMP 10/08/2014   SpO2 96%   Visual Acuity Right Eye Distance:   Left Eye Distance:   Bilateral Distance:    Right Eye Near:   Left Eye Near:    Bilateral Near:     Physical Exam Constitutional:      Appearance: She is ill-appearing. She is not toxic-appearing or diaphoretic.  HENT:     Head: Normocephalic.     Mouth/Throat:     Mouth: Mucous membranes are dry.  Eyes:     Extraocular Movements: Extraocular movements intact.     Pupils: Pupils are equal, round, and reactive to light.  Cardiovascular:     Rate and Rhythm: Tachycardia present.     Heart sounds: No murmur. No friction rub.  Pulmonary:     Effort: Pulmonary effort is normal.     Breath sounds: Normal breath sounds.  Abdominal:     General: Bowel sounds are normal.     Palpations: Abdomen is soft.  Skin:    General: Skin is warm and dry.  Neurological:     Mental Status: She is alert and oriented to person, place,  and time.     Motor: No weakness.     Coordination: Coordination normal.     Gait: Gait normal.  Psychiatric:        Mood and Affect: Mood normal.        Speech: Speech normal.      UC Treatments / Results  Labs (all labs ordered are listed, but only abnormal results are displayed) Labs Reviewed  CBG MONITORING, ED    EKG   Radiology No results found.  Procedures Procedures (including critical care time)  Medications Ordered in UC Medications - No data to display  Initial Impression / Assessment and Plan / UC Course  I have reviewed the triage vital signs and the nursing notes.  Pertinent labs & imaging results that were available during my care of the patient were reviewed by me and considered in my medical decision making (see chart for details).     Patient presents today appearing ill with multiple non-specific symptoms and low grade fevers for several days. COVID-19 testing pending. Glucose checked and elevated 208. In review of EMR patient has had elevated glucose reading for more than 1 year without a diagnosis of diabetes. No PCP follow since 2018. Patient treated with IV fluids for dehydration related to fever and exacerbated by hyperglycemia.  Tachycardic on arrival. EKG confirmed tachycardia without ischemic changes. Heart rate improved with rehydration. Patient started on metformin 500 mg twice daily and advised to follow-up with PCP.  BMP showed slight increase in creatinine and elevated glucose, otherwise reassuring. Discussed plan of care with daughter and recommendation for follow-up with PCP routinely. Final Clinical Impressions(s) / UC Diagnoses   Final diagnoses:  Nonintractable headache, unspecified chronicity pattern, unspecified headache type  Hyperglycemia  Dehydration  Acute nonintractable headache, unspecified headache type  Encounter for laboratory testing for COVID-19 virus     Discharge Instructions     Call to schedule an appointment  with Dr. Carlota Raspberry at [facility] 01/23/20. You will need follow-up blood work and evaluation of diabetes given elevated blood sugar.  Starting you on low dose Metformin 500 mg twice daily with food only. If you are unable to eat food, hold dose of medication as medication must be taken with food. Continue blood pressure medication. Drink as much fluid as possible  to prevent dehydration.  Your COVID 19 results will be available in 48-72 hours. Negative results are immediately resulted to Mychart. All positive results are communicated with a phone call from our office.      ED Prescriptions    Medication Sig Dispense Auth. Provider   metFORMIN (GLUCOPHAGE) 500 MG tablet Take 1 tablet (500 mg total) by mouth 2 (two) times daily with a meal. 60 tablet Scot Jun, FNP     PDMP not reviewed this encounter.   Scot Jun, FNP 01/21/20 [phone]

## 2020-01-22 LAB — NOVEL CORONAVIRUS, NAA (HOSP ORDER, SEND-OUT TO REF LAB; TAT 18-24 HRS): SARS-CoV-2, NAA: NOT DETECTED

## 2020-01-24 ENCOUNTER — Encounter: Payer: Self-pay | Admitting: Family Medicine

## 2020-01-24 ENCOUNTER — Ambulatory Visit (INDEPENDENT_AMBULATORY_CARE_PROVIDER_SITE_OTHER): Payer: 59 | Admitting: Family Medicine

## 2020-01-24 ENCOUNTER — Other Ambulatory Visit: Payer: Self-pay

## 2020-01-24 VITALS — BP 110/77 | HR 100 | Temp 99.2°F | Ht <= 58 in | Wt 120.0 lb

## 2020-01-24 DIAGNOSIS — R110 Nausea: Secondary | ICD-10-CM

## 2020-01-24 DIAGNOSIS — T50905A Adverse effect of unspecified drugs, medicaments and biological substances, initial encounter: Secondary | ICD-10-CM

## 2020-01-24 DIAGNOSIS — R5383 Other fatigue: Secondary | ICD-10-CM

## 2020-01-24 DIAGNOSIS — Z131 Encounter for screening for diabetes mellitus: Secondary | ICD-10-CM | POA: Diagnosis not present

## 2020-01-24 DIAGNOSIS — R1013 Epigastric pain: Secondary | ICD-10-CM | POA: Diagnosis not present

## 2020-01-24 DIAGNOSIS — R739 Hyperglycemia, unspecified: Secondary | ICD-10-CM | POA: Diagnosis not present

## 2020-01-24 DIAGNOSIS — G4489 Other headache syndrome: Secondary | ICD-10-CM

## 2020-01-24 LAB — POCT URINALYSIS DIP (MANUAL ENTRY)
Bilirubin, UA: NEGATIVE
Blood, UA: NEGATIVE
Glucose, UA: NEGATIVE mg/dL
Ketones, POC UA: NEGATIVE mg/dL
Leukocytes, UA: NEGATIVE
Nitrite, UA: NEGATIVE
Spec Grav, UA: 1.02 (ref 1.010–1.025)
Urobilinogen, UA: 0.2 E.U./dL
pH, UA: 5.5 (ref 5.0–8.0)

## 2020-01-24 LAB — POCT GLYCOSYLATED HEMOGLOBIN (HGB A1C): Hemoglobin A1C: 5.5 % (ref 4.0–5.6)

## 2020-01-24 LAB — GLUCOSE, POCT (MANUAL RESULT ENTRY): POC Glucose: 84 mg/dl (ref 70–99)

## 2020-01-24 MED ORDER — ONDANSETRON HCL 4 MG PO TABS: ORAL_TABLET | ORAL | 0 refills | Status: DC

## 2020-01-24 MED ORDER — OMEPRAZOLE 40 MG PO CPDR: DELAYED_RELEASE_CAPSULE | ORAL | 0 refills | Status: DC

## 2020-01-24 NOTE — Addendum Note (Signed)
Addended by: Amalia Hailey on: 01/24/2020 03:04 PM   Modules accepted: Orders

## 2020-01-24 NOTE — Patient Instructions (Addendum)
  Your blood tests show no evidence of diabetes.  They are wonderful today.  Your hemoglobin A1c which should be less than 6 is only 5.5 and your blood sugar is 84.  You are not diabetic.  You can discontinue the Metformin.  It is probably causing you start to feel worse.  She does not need it.  There are a couple of other tests regarding your stomach that are pending, but I do not expect anything major to show up on them.  I will let you know the results of them in a couple of days.  I have prescribed some medicine for your stomach to reduce the stomach acid to see if that will calm it down.  Also I have given you a little medicine for nausea if you feel like you are going to vomit.  I think you just have a little stomach bug that will pass.  Ondansetron 4 mg every 6 or 8 hours only if needed for nausea  Omeprazole 40 mg 1 each evening to reduce stomach acid.  This should help the upper stomach pain.  If you are doing well for 1 week, you can discontinue it.  If it continues to persist you might need some other testing or evaluation done.  If you get worse with recurrent fever or develop a cough you should go get your Covid testing one more time, but I do not think it is necessary today.  Return or go elsewhere if problems persist or get worse.   If you have lab work done today you will be contacted with your lab results within the next 2 weeks.  If you have not heard from [place] then please contact us. The fastest way to get your results is to register for My Chart.   IF you received an x-ray today, you will receive an invoice from [facility]. Please contact [facility] at [phone] with questions or concerns regarding your invoice.   IF you received labwork today, you will receive an invoice from [place]. Please contact [facility] at [phone] with questions or concerns regarding your invoice.   Our billing staff will not be able to assist you with questions regarding  bills from these companies.  You will be contacted with the lab results as soon as they are available. The fastest way to get your results is to activate your My Chart account. Instructions are located on the last page of this paperwork. If you have not heard from [place] regarding the results in 2 weeks, please contact this office.

## 2020-01-24 NOTE — Progress Notes (Signed)
Patient ID: Glenda Pham, female    DOB: 1961/07/11  Age: 59 y.o. MRN: [id number]  Chief Complaint  Patient presents with  . Illness    told that glucose is very high, pain in abs yet not as bad today. Not able to void right now    Subjective:   59 year old lady who comes in here today to get her sugar rechecked.  She had been at an urgent care 3 days ago and told that her sugar was high and that was the cause of her fatigue and the need for get it rechecked.  She had actually gone to the urgent care because of a headache, which is now decreased to just to have a sensation in her head.  She has been feeling fatigued for a year.  It is very nonspecific.  The last 3 days she has had epigastric pain and nausea without vomiting.  She just does not feel good.  She did have Covid testing done which was -3 days ago.  She has not had coughing except for when she was dry heaving yesterday.  She had some loose stools for a few days but has not had a bowel movement today.  She did apparently have a little bit of a fever for few days, but that too is gone.  This stomach problem is a new problem, she does not complain of it much in the past.  The patient does not speak English, or at least very little, but her daughter who speaks excellent English interpreting for her.  Current allergies, medications, problem list, past/family and social histories reviewed.  Objective:  BP 110/77   Pulse 100   Temp 99.2 F (37.3 C)   Ht 4' 9.09" (1.45 m)   Wt 120 lb (54.4 kg)   LMP 10/08/2014   SpO2 96%   BMI 25.89 kg/m   No major acute distress.  Alert and oriented.  Laying on the exam table when I entered, but when I had her sit up she did fine.  Apparently she has been fatigued if she is up very long.  Her neck supple and nontender.  No bruits.  Chest clear to auscultation.  Heart rate without murmur.  Abdomen has normal bowel sounds, soft without masses.  Mild epigastric tenderness.  No ankle edema. Assessment &  Plan:   Assessment: 1. Epigastric pain   2. Fatigue, unspecified type   3. Screening for diabetes mellitus   4. Hyperglycemia   5. Other headache syndrome   6. Nausea without vomiting   7. Adverse effect of drug, initial encounter       Plan: We will recheck her diabetic tests.  Reviewed her chart.  We will add to that another CBC which she did have a few days ago and a lipase.  Her CMP was fairly normal.  She does not have a history of sugar elevation in the past.  Orders Placed This Encounter  Procedures  . CBC  . Lipase  . POCT glucose (manual entry)  . POCT glycosylated hemoglobin (Hb A1C)    Meds ordered this encounter  Medications  . omeprazole (PRILOSEC) 40 MG capsule    Sig: Take 1 daily in the evening to reduce stomach acid production.    Dispense:  30 capsule    Refill:  0  . ondansetron (ZOFRAN) 4 MG tablet    Sig: Take 1 every 6 or 8 hours if needed for nausea or vomiting.    Dispense:  10 tablet  Refill:  0   At the end of the visit I found out she had been placed on metformin in the urgent care.  She had only been on it for 3 days and certainly that is not enough to have affected the A1c.  She is not diabetic.      Patient Instructions    Your blood tests show no evidence of diabetes.  They are wonderful today.  Your hemoglobin A1c which should be less than 6 is only 5.5 and your blood sugar is 84.  You are not diabetic.  You can discontinue the Metformin.  It is probably causing you start to feel worse.  She does not need it.  There are a couple of other tests regarding your stomach that are pending, but I do not expect anything major to show up on them.  I will let you know the results of them in a couple of days.  I have prescribed some medicine for your stomach to reduce the stomach acid to see if that will calm it down.  Also I have given you a little medicine for nausea if you feel like you are going to vomit.  I think you just have a little  stomach bug that will pass.  Ondansetron 4 mg every 6 or 8 hours only if needed for nausea  Omeprazole 40 mg 1 each evening to reduce stomach acid.  This should help the upper stomach pain.  If you are doing well for 1 week, you can discontinue it.  If it continues to persist you might need some other testing or evaluation done.  If you get worse with recurrent fever or develop a cough you should go get your Covid testing one more time, but I do not think it is necessary today.  Return or go elsewhere if problems persist or get worse.   If you have lab work done today you will be contacted with your lab results within the next 2 weeks.  If you have not heard from [place] then please contact us. The fastest way to get your results is to register for My Chart.   IF you received an x-ray today, you will receive an invoice from [facility]. Please contact [facility] at [phone] with questions or concerns regarding your invoice.   IF you received labwork today, you will receive an invoice from [place]. Please contact [facility] at [phone] with questions or concerns regarding your invoice.   Our billing staff will not be able to assist you with questions regarding bills from these companies.  You will be contacted with the lab results as soon as they are available. The fastest way to get your results is to activate your My Chart account. Instructions are located on the last page of this paperwork. If you have not heard from [place] regarding the results in 2 weeks, please contact this office.         Return if symptoms worsen or fail to improve.   Ruben Reason, MD 01/24/2020

## 2020-01-25 LAB — CBC
Hematocrit: 40.9 % (ref 34.0–46.6)
Hemoglobin: 14.4 g/dL (ref 11.1–15.9)
MCH: 29.9 pg (ref 26.6–33.0)
MCHC: 35.2 g/dL (ref 31.5–35.7)
MCV: 85 fL (ref 79–97)
Platelets: 198 10*3/uL (ref 150–450)
RBC: 4.81 x10E6/uL (ref 3.77–5.28)
RDW: 12.3 % (ref 11.7–15.4)
WBC: 6.8 10*3/uL (ref 3.4–10.8)

## 2020-01-25 LAB — LIPASE: Lipase: 40 U/L (ref 14–72)

## 2020-02-01 ENCOUNTER — Encounter: Payer: Self-pay | Admitting: Radiology

## 2020-05-07 ENCOUNTER — Ambulatory Visit (INDEPENDENT_AMBULATORY_CARE_PROVIDER_SITE_OTHER): Payer: 59 | Admitting: Registered Nurse

## 2020-05-07 ENCOUNTER — Encounter: Payer: Self-pay | Admitting: Registered Nurse

## 2020-05-07 ENCOUNTER — Other Ambulatory Visit: Payer: Self-pay

## 2020-05-07 VITALS — BP 119/71 | Temp 98.1°F | Ht <= 58 in | Wt 122.0 lb

## 2020-05-07 VITALS — BP 119/71 | HR 100 | Temp 98.1°F | Resp 16 | Ht <= 58 in | Wt 122.4 lb

## 2020-05-07 DIAGNOSIS — Z124 Encounter for screening for malignant neoplasm of cervix: Secondary | ICD-10-CM

## 2020-05-07 DIAGNOSIS — Z1231 Encounter for screening mammogram for malignant neoplasm of breast: Secondary | ICD-10-CM

## 2020-05-07 DIAGNOSIS — Z23 Encounter for immunization: Secondary | ICD-10-CM

## 2020-05-07 DIAGNOSIS — H547 Unspecified visual loss: Secondary | ICD-10-CM

## 2020-05-07 DIAGNOSIS — M79604 Pain in right leg: Secondary | ICD-10-CM

## 2020-05-07 DIAGNOSIS — Z130 Encounter for screening for diseases of the blood and blood-forming organs and certain disorders involving the immune mechanism: Secondary | ICD-10-CM

## 2020-05-07 DIAGNOSIS — M79605 Pain in left leg: Secondary | ICD-10-CM

## 2020-05-07 DIAGNOSIS — Z0001 Encounter for general adult medical examination with abnormal findings: Secondary | ICD-10-CM

## 2020-05-07 DIAGNOSIS — Z1159 Encounter for screening for other viral diseases: Secondary | ICD-10-CM

## 2020-05-07 DIAGNOSIS — Z0000 Encounter for general adult medical examination without abnormal findings: Secondary | ICD-10-CM

## 2020-05-07 DIAGNOSIS — Z13228 Encounter for screening for other metabolic disorders: Secondary | ICD-10-CM

## 2020-05-07 DIAGNOSIS — Z1329 Encounter for screening for other suspected endocrine disorder: Secondary | ICD-10-CM

## 2020-05-07 DIAGNOSIS — I10 Essential (primary) hypertension: Secondary | ICD-10-CM

## 2020-05-07 DIAGNOSIS — Z7689 Persons encountering health services in other specified circumstances: Secondary | ICD-10-CM

## 2020-05-07 MED ORDER — AMLODIPINE BESYLATE 2.5 MG PO TABS: 2.5000 mg | ORAL_TABLET | Freq: Every day | ORAL | 3 refills | Status: DC

## 2020-05-07 NOTE — Progress Notes (Signed)
Established Patient Office Visit  Subjective:  Patient ID: Glenda Pham, female    DOB: Jul 15, 1961  Age: 59 y.o. MRN: [id number]  CC:  Chief Complaint  Patient presents with  . Transitions Of Care    patient would like a physica and check glucose    HPI Glenda Pham presents for visit to establish care, CPE, and labs  Formerly a patient of Dr. Vonna Kotyk. Hx up to date in chart - does note a remote history of single episode of seizure that was omitted, however. She states that this is why she experiences nystagmus. Declines any change to this or any other new neuro symptoms.  Otherwise, notes bilat leg pain. States this is in the knees and heel: Knees: worse when standing long periods. Has been at home through [place]. Notably, wearing shoes less often, taking care of three grandchildren, on her feet a lot more and lifting a lot more. Heels: bottom of heel, worse in morning. Of note, wearing shoes less often, when she does, they are less supportive.   Hx of elevated sugars: has been on metformin in past, did not tolerate well. Has stopped taking this. Does not wish to do this again. Last A1c was in January, 5.5.  HTN: Taking amlodipine 2.5mg  PO qd with good effect. No symptoms of elevated bp. Denies chest pain, shob, doe, headache, acute visual changes, dependent edema.  Worsening visual acuity: glasses rx last updated around 10 years ago. Pt notes slow decline over that time in visual acuity, no acute changes. She is confident that it is age related. Requesting referral to optometry  Otherwise, due for mammo, pap, and colonoscopy. Referrals to be sent for each of these per pt request.  Past Medical History:  Diagnosis Date  . Depression   . Headache   . Hypertension     Past Surgical History:  Procedure Laterality Date  . HAND SURGERY Right   . LASIK    . TUMOR REMOVAL     Left shoulder - benign    History reviewed. No pertinent family history.  Social History    Socioeconomic History  . Marital status: Married    Spouse name: Not on file  . Number of children: 3  . Years of education: 6 years  . Highest education level: Not on file  Occupational History  . Occupation: Nail salon  Tobacco Use  . Smoking status: Never Smoker  . Smokeless tobacco: Never Used  Substance and Sexual Activity  . Alcohol use: No  . Drug use: No  . Sexual activity: Yes  Other Topics Concern  . Not on file  Social History Narrative   Lives at home with her husband.   Right-handed.   No caffeine use.   Social Determinants of Health   Financial Resource Strain:   . Difficulty of Paying Living Expenses:   Food Insecurity:   . Worried About Charity fundraiser in the Last Year:   . Arboriculturist in the Last Year:   Transportation Needs:   . Film/video editor (Medical):   Marland Kitchen Lack of Transportation (Non-Medical):   Physical Activity:   . Days of Exercise per Week:   . Minutes of Exercise per Session:   Stress:   . Feeling of Stress :   Social Connections:   . Frequency of Communication with Friends and Family:   . Frequency of Social Gatherings with Friends and Family:   . Attends Religious Services:   . Active Member  of Clubs or Organizations:   . Attends Archivist Meetings:   Marland Kitchen Marital Status:   Intimate Partner Violence:   . Fear of Current or Ex-Partner:   . Emotionally Abused:   Marland Kitchen Physically Abused:   . Sexually Abused:     Outpatient Medications Prior to Visit  Medication Sig Dispense Refill  . amLODipine (NORVASC) 5 MG tablet Take 5 mg by mouth daily.    Marland Kitchen acetaminophen (TYLENOL) 325 MG tablet Take 325 mg by mouth daily as needed for mild pain.    . metFORMIN (GLUCOPHAGE) 500 MG tablet Take 1 tablet (500 mg total) by mouth 2 (two) times daily with a meal. (Patient not taking: Reported on 05/07/2020) 60 tablet 0  . omeprazole (PRILOSEC) 40 MG capsule Take 1 daily in the evening to reduce stomach acid production. (Patient not  taking: Reported on 05/07/2020) 30 capsule 0  . ondansetron (ZOFRAN) 4 MG tablet Take 1 every 6 or 8 hours if needed for nausea or vomiting. (Patient not taking: Reported on 05/07/2020) 10 tablet 0   No facility-administered medications prior to visit.    Allergies  Allergen Reactions  . Fluoxetine Hcl Other (See Comments)    Epistaxis once, hypertension?  . Metformin And Related   . Vancomycin Itching    ROS Review of Systems  Constitutional: Negative.   HENT: Negative.   Eyes: Positive for visual disturbance. Negative for photophobia, pain, discharge, redness and itching.  Respiratory: Negative.   Cardiovascular: Negative.   Gastrointestinal: Negative.   Endocrine: Negative.   Genitourinary: Negative.   Musculoskeletal: Positive for arthralgias. Negative for back pain, gait problem, joint swelling, myalgias, neck pain and neck stiffness.  Skin: Negative.   Allergic/Immunologic: Negative.   Neurological: Negative.   Hematological: Negative.   Psychiatric/Behavioral: Negative.   All other systems reviewed and are negative.     Objective:    Physical Exam  Constitutional: She is oriented to person, place, and time. She appears well-developed and well-nourished. No distress.  HENT:  Head: Normocephalic and atraumatic.  Right Ear: External ear normal.  Left Ear: External ear normal.  Nose: Nose normal.  Mouth/Throat: Oropharynx is clear and moist. No oropharyngeal exudate.  Eyes: Pupils are equal, round, and reactive to light. Conjunctivae are normal. Left eye exhibits no discharge. No scleral icterus. Right eye exhibits abnormal extraocular motion and nystagmus. Left eye exhibits abnormal extraocular motion and nystagmus.  Nystagmus when looking lower left and lower right. Pt states this has been steady since she was a child - single instance of seizure. No other neuro symptoms at this time. Pt declines further work up. Acknowledges risks.  Neck: No JVD present. No tracheal  deviation present. No thyromegaly present.  Cardiovascular: Normal rate, regular rhythm, normal heart sounds and intact distal pulses. Exam reveals no gallop and no friction rub.  No murmur heard. Pulmonary/Chest: Effort normal and breath sounds normal. No respiratory distress. She has no wheezes. She has no rales. She exhibits no tenderness.  Abdominal: Soft. Bowel sounds are normal. She exhibits no distension and no mass. There is no abdominal tenderness. There is no rebound and no guarding.  Musculoskeletal:        General: No tenderness, deformity or edema. Normal range of motion.     Cervical back: Normal range of motion and neck supple.  Lymphadenopathy:    She has no cervical adenopathy.  Neurological: She is alert and oriented to person, place, and time. No cranial nerve deficit. She exhibits normal muscle  tone. Coordination normal.  Skin: Skin is warm and dry. No rash noted. She is not diaphoretic. No erythema. No pallor.  Psychiatric: She has a normal mood and affect. Her behavior is normal. Judgment and thought content normal.  Nursing note and vitals reviewed.   BP 119/71   Pulse 100   Temp 98.1 F (36.7 C) (Temporal)   Resp 16   Ht 4\' 9"  (1.448 m)   Wt 122 lb 6.4 oz (55.5 kg)   LMP 10/08/2014   SpO2 97%   BMI 26.49 kg/m  Wt Readings from Last 3 Encounters:  05/07/20 122 lb 6.4 oz (55.5 kg)  01/24/20 120 lb (54.4 kg)  01/22/19 119 lb 7.8 oz (54.2 kg)     Health Maintenance Due  Topic Date Due  . Hepatitis C Screening  Never done  . TETANUS/TDAP  Never done  . MAMMOGRAM  Never done  . COLONOSCOPY  Never done    There are no preventive care reminders to display for this patient.  Lab Results  Component Value Date   TSH 1.260 02/16/2017   Lab Results  Component Value Date   WBC 6.8 01/24/2020   HGB 14.4 01/24/2020   HCT 40.9 01/24/2020   MCV 85 01/24/2020   PLT 198 01/24/2020   Lab Results  Component Value Date   NA 135 01/21/2020   K 3.8 01/21/2020    CO2 23 01/21/2020   GLUCOSE 164 (H) 01/21/2020   BUN 20 01/21/2020   CREATININE 1.10 (H) 01/21/2020   BILITOT 1.1 01/21/2020   ALKPHOS 72 01/21/2020   AST 40 01/21/2020   ALT 42 01/21/2020   PROT 8.0 01/21/2020   ALBUMIN 4.2 01/21/2020   CALCIUM 9.0 01/21/2020   ANIONGAP 12 01/21/2020   No results found for: CHOL No results found for: HDL No results found for: LDLCALC No results found for: TRIG No results found for: CHOLHDL Lab Results  Component Value Date   HGBA1C 5.5 01/24/2020      Assessment & Plan:   Problem List Items Addressed This Visit    None    Visit Diagnoses    Screening for endocrine, metabolic and immunity disorder    -  Primary   Relevant Orders   Ambulatory referral to Gastroenterology   Comprehensive metabolic panel   TSH   Encounter for screening for other viral diseases       Relevant Orders   Hepatitis C antibody   Screening mammogram, encounter for       Relevant Orders   MM Digital Diagnostic Bilat   Need for diphtheria-tetanus-pertussis (Tdap) vaccine       Relevant Orders   Tdap vaccine greater than or equal to 7yo IM   Pain in both lower extremities       Relevant Orders   Vitamin D, 25-hydroxy   Vitamin B12      No orders of the defined types were placed in this encounter.   Follow-up: No follow-ups on file.   PLAN  Noted bilat nystagmus on exam. Pt declines further workup. Otherwise, exam unremarkable  Labs collected. Will follow up as warranted  Suspect plantar fasciitis and OA for heel and knee pain, respectively. Suggest OTCs and nonpharm for conservative treatment.  Patient encouraged to call clinic with any questions, comments, or concerns.  Maximiano Coss, NP

## 2020-05-07 NOTE — Patient Instructions (Signed)
° ° ° °  If you have lab work done today you will be contacted with your lab results within the next 2 weeks.  If you have not heard from us then please contact us. The fastest way to get your results is to register for My Chart. ° ° °IF you received an x-ray today, you will receive an invoice from [facility]. Please contact [facility] at [phone] with questions or concerns regarding your invoice.  ° °IF you received labwork today, you will receive an invoice from [facility]. Please contact [facility] at [phone] with questions or concerns regarding your invoice.  ° °Our billing staff will not be able to assist you with questions regarding bills from these companies. ° °You will be contacted with the lab results as soon as they are available. The fastest way to get your results is to activate your My Chart account. Instructions are located on the last page of this paperwork. If you have not heard from us regarding the results in 2 weeks, please contact this office. °  ° ° ° °

## 2020-05-08 LAB — COMPREHENSIVE METABOLIC PANEL
ALT: 18 IU/L (ref 0–32)
AST: 16 IU/L (ref 0–40)
Albumin/Globulin Ratio: 1.4 (ref 1.2–2.2)
Albumin: 4.6 g/dL (ref 3.8–4.9)
Alkaline Phosphatase: 82 IU/L (ref 39–117)
BUN/Creatinine Ratio: 21 (ref 9–23)
BUN: 13 mg/dL (ref 6–24)
Bilirubin Total: 0.7 mg/dL (ref 0.0–1.2)
CO2: 23 mmol/L (ref 20–29)
Calcium: 9.6 mg/dL (ref 8.7–10.2)
Chloride: 104 mmol/L (ref 96–106)
Creatinine, Ser: 0.62 mg/dL (ref 0.57–1.00)
GFR calc Af Amer: 115 mL/min/{1.73_m2} (ref >59)
GFR calc non Af Amer: 100 mL/min/{1.73_m2} (ref >59)
Globulin, Total: 3.3 g/dL (ref 1.5–4.5)
Glucose: 96 mg/dL (ref 65–99)
Potassium: 4.1 mmol/L (ref 3.5–5.2)
Sodium: 141 mmol/L (ref 134–144)
Total Protein: 7.9 g/dL (ref 6.0–8.5)

## 2020-05-08 LAB — TSH: TSH: 1.61 u[IU]/mL (ref 0.450–4.500)

## 2020-05-08 LAB — VITAMIN B12: Vitamin B-12: 1239 pg/mL (ref 232–1245)

## 2020-05-08 LAB — HEPATITIS C ANTIBODY: Hep C Virus Ab: <0.1 s/co ratio (ref 0.0–0.9)

## 2020-05-08 LAB — VITAMIN D 25 HYDROXY (VIT D DEFICIENCY, FRACTURES): Vit D, 25-Hydroxy: 17.5 ng/mL — ABNORMAL LOW (ref 30.0–100.0)

## 2020-05-16 ENCOUNTER — Other Ambulatory Visit: Payer: Self-pay

## 2020-05-16 ENCOUNTER — Other Ambulatory Visit: Payer: Self-pay | Admitting: Registered Nurse

## 2020-05-16 DIAGNOSIS — Z1211 Encounter for screening for malignant neoplasm of colon: Secondary | ICD-10-CM

## 2020-05-17 ENCOUNTER — Encounter: Payer: Self-pay | Admitting: Gastroenterology

## 2020-05-17 ENCOUNTER — Ambulatory Visit (INDEPENDENT_AMBULATORY_CARE_PROVIDER_SITE_OTHER): Payer: 59 | Admitting: Nurse Practitioner

## 2020-05-17 ENCOUNTER — Encounter: Payer: Self-pay | Admitting: Nurse Practitioner

## 2020-05-17 VITALS — BP 124/80 | Ht <= 58 in | Wt 121.6 lb

## 2020-05-17 DIAGNOSIS — Z01419 Encounter for gynecological examination (general) (routine) without abnormal findings: Secondary | ICD-10-CM | POA: Diagnosis not present

## 2020-05-17 DIAGNOSIS — N951 Menopausal and female climacteric states: Secondary | ICD-10-CM

## 2020-05-17 NOTE — Patient Instructions (Addendum)
Call to schedule mammogram  [place] [phone] [address] [place], [place] [address]  Replenz over the [employer] or oil-based lubricant during intercourse. [employer]ant   Health Maintenance, Female Adopting a healthy lifestyle and getting preventive care are important in promoting health and wellness. Ask your health care provider about:  The right schedule for you to have regular tests and exams.  Things you can do on your own to prevent diseases and keep yourself healthy. What should I know about diet, weight, and exercise? Eat a healthy diet   Eat a diet that includes plenty of vegetables, fruits, low-fat dairy products, and lean protein.  Do not eat a lot of foods that are high in solid fats, added sugars, or sodium. Maintain a healthy weight Body mass index (BMI) is used to identify weight problems. It estimates body fat based on height and weight. Your health care provider can help determine your BMI and help you achieve or maintain a healthy weight. Get regular exercise Get regular exercise. This is one of the most important things you can do for your health. Most adults should:  Exercise for at least 150 minutes each week. The exercise should increase your heart rate and make you sweat (moderate-intensity exercise).  Do strengthening exercises at least twice a week. This is in addition to the moderate-intensity exercise.  Spend less time sitting. Even light physical activity can be beneficial. Watch cholesterol and blood lipids Have your blood tested for lipids and cholesterol at 59 years of age, then have this test every 5 years. Have your cholesterol levels checked more often if:  Your lipid or cholesterol levels are high.  You are older than 59 years of age.  You are at high risk for heart disease. What should I know about cancer screening? Depending on your health history and family history, you may need to have  cancer screening at various ages. This may include screening for:  Breast cancer.  Cervical cancer.  Colorectal cancer.  Skin cancer.  Lung cancer. What should I know about heart disease, diabetes, and high blood pressure? Blood pressure and heart disease  High blood pressure causes heart disease and increases the risk of stroke. This is more likely to develop in people who have high blood pressure readings, are of African descent, or are overweight.  Have your blood pressure checked: ? Every 3-5 years if you are 26-52 years of age. ? Every year if you are 81 years old or older. Diabetes Have regular diabetes screenings. This checks your fasting blood sugar level. Have the screening done:  Once every three years after age 20 if you are at a normal weight and have a low risk for diabetes.  More often and at a younger age if you are overweight or have a high risk for diabetes. What should I know about preventing infection? Hepatitis B If you have a higher risk for hepatitis B, you should be screened for this virus. Talk with your health care provider to find out if you are at risk for hepatitis B infection. Hepatitis C Testing is recommended for:  Everyone born from [birth year] through 1965.  Anyone with known risk factors for hepatitis C. Sexually transmitted infections (STIs)  Get screened for STIs, including gonorrhea and chlamydia, if: ? You are sexually active and are younger than 59 years of age. ? You are older than 59 years of age and your health care provider tells you  that you are at risk for this type of infection. ? Your sexual activity has changed since you were last screened, and you are at increased risk for chlamydia or gonorrhea. Ask your health care provider if you are at risk.  Ask your health care provider about whether you are at high risk for HIV. Your health care provider may recommend a prescription medicine to help prevent HIV infection. If you choose to take  medicine to prevent HIV, you should first get tested for HIV. You should then be tested every 3 months for as long as you are taking the medicine. Pregnancy  If you are about to stop having your period (premenopausal) and you may become pregnant, seek counseling before you get pregnant.  Take 400 to 800 micrograms (mcg) of folic acid every day if you become pregnant.  Ask for birth control (contraception) if you want to prevent pregnancy. Osteoporosis and menopause Osteoporosis is a disease in which the bones lose minerals and strength with aging. This can result in bone fractures. If you are 40 years old or older, or if you are at risk for osteoporosis and fractures, ask your health care provider if you should:  Be screened for bone loss.  Take a calcium or vitamin D supplement to lower your risk of fractures.  Be given hormone replacement therapy (HRT) to treat symptoms of menopause. Follow these instructions at home: Lifestyle  Do not use any products that contain nicotine or tobacco, such as cigarettes, e-cigarettes, and chewing tobacco. If you need help quitting, ask your health care provider.  Do not use street drugs.  Do not share needles.  Ask your health care provider for help if you need support or information about quitting drugs. Alcohol use  Do not drink alcohol if: ? Your health care provider tells you not to drink. ? You are pregnant, may be pregnant, or are planning to become pregnant.  If you drink alcohol: ? Limit how much you use to 0-1 drink a day. ? Limit intake if you are breastfeeding.  Be aware of how much alcohol is in your drink. In the [place] one drink equals one 12 oz bottle of beer (355 mL), one 5 oz glass of wine (148 mL), or one 1 oz glass of hard liquor (44 mL). General instructions  Schedule regular health, dental, and eye exams.  Stay current with your vaccines.  Tell your health care provider if: ? You often feel depressed. ? You have  ever been abused or do not feel safe at home. Summary  Adopting a healthy lifestyle and getting preventive care are important in promoting health and wellness.  Follow your health care provider's instructions about healthy diet, exercising, and getting tested or screened for diseases.  Follow your health care provider's instructions on monitoring your cholesterol and blood pressure. This information is not intended to replace advice given to you by your health care provider. Make sure you discuss any questions you have with your health care provider. Document Revised: 12/08/2018 Document Reviewed: 12/08/2018 Elsevier Patient Education  2020 [employer].

## 2020-05-17 NOTE — Addendum Note (Signed)
Addended by: Gae Gallop T on: 05/17/2020 09:26 AM   Modules accepted: Orders

## 2020-05-17 NOTE — Progress Notes (Addendum)
   Glenda Pham 14-Apr-1961 [id number]   History:  59 y.o. MVF G3 P3 presents as new patient for annual exam.  Interpreter present.  No GYN complaints.  Last menstrual cycle was sometime in 2020.  Complains of vaginal dryness that causes irritation at times and painful intercourse.  History of migraines and hypertension.  Recently established care with a PCP who ordered screening mammogram and colonoscopy.   Contraception: post menopausal status Last Pap: UNK. Pt reports over 30 years ago Last mammogram: never  Past medical history, past surgical history, family history and social history were all reviewed and documented in the EPIC chart.  ROS:  A ROS was performed and pertinent positives and negatives are included.  Exam:  Vitals:   05/17/20 0837  BP: 124/80  Weight: 121 lb 9.6 oz (55.2 kg)  Height: 4\' 9"  (1.448 m)   Body mass index is 26.31 kg/m.  General appearance:  Normal Thyroid:  Symmetrical, normal in size, without palpable masses or nodularity. Respiratory  Auscultation:  Clear without wheezing or rhonchi Cardiovascular  Auscultation:  Regular rate, without rubs, murmurs or gallops  Edema/varicosities:  Not grossly evident Abdominal  Soft,nontender, without masses, guarding or rebound.  Liver/spleen:  No organomegaly noted  Hernia:  None appreciated  Skin  Inspection:  Grossly normal   Breasts: Examined lying and sitting.   Right: Without masses, retractions, discharge or axillary adenopathy.   Left: Without masses, retractions, discharge or axillary adenopathy. Gentitourinary   Inguinal/mons:  Normal without inguinal adenopathy  External genitalia:  Normal  BUS/Urethra/Skene's glands:  Normal  Vagina:  Normal  Cervix:  Normal  Uterus:  Normal in size, shape and contour.  Midline and mobile  Adnexa/parametria:     Rt: Without masses or tenderness.   Lt: Without masses or tenderness.  Anus and perineum: Normal  Digital rectal exam: Normal sphincter tone  without palpated masses or tenderness  Assessment/Plan:  59 y.o. MVF for annual exam.   Well female exam with routine gynecological exam - No abnormal findings.  Education provided on SBEs, importance of preventative screenings, current guidelines, high calcium diet, regular exercise, and multivitamin daily. Labs done with PCP 1 week ago. Pap with HR HPV today  Need for screening mammogram -information provided on the breast center and the importance of screening  Vaginal dryness due to menopause -discussed options.  Recommend Replens and lubrication during intercourse  Follow-up in 1 year for annual    [place], 8:45 AM 05/17/2020

## 2020-05-18 LAB — PAP, TP IMAGING W/ HPV RNA, RFLX HPV TYPE 16,18/45: HPV DNA High Risk: NOT DETECTED

## 2020-05-22 ENCOUNTER — Other Ambulatory Visit: Payer: Self-pay | Admitting: Registered Nurse

## 2020-05-22 ENCOUNTER — Telehealth: Payer: Self-pay | Admitting: Registered Nurse

## 2020-05-22 DIAGNOSIS — E559 Vitamin D deficiency, unspecified: Secondary | ICD-10-CM

## 2020-05-22 DIAGNOSIS — Z1231 Encounter for screening mammogram for malignant neoplasm of breast: Secondary | ICD-10-CM

## 2020-05-22 MED ORDER — VITAMIN D (ERGOCALCIFEROL) 1.25 MG (50000 UNIT) PO CAPS: 50000.0000 [IU] | ORAL_CAPSULE | ORAL | 0 refills | Status: AC

## 2020-05-22 NOTE — Telephone Encounter (Signed)
Done  Thanks  Denice Paradise

## 2020-05-22 NOTE — Telephone Encounter (Signed)
Can you please change to screening mammo to match dx code

## 2020-05-22 NOTE — Progress Notes (Signed)
Good evening,  If we could call Ms Nappier, her vitamin D is very low. I'm going to send over a prescription supplement for her to take once weekly. I'd like to recheck these numbers in around 8 weeks.  Thank you,  Kathrin Ruddy, NP

## 2020-05-22 NOTE — Telephone Encounter (Signed)
Good morning,  Can you please changer diagnostic mammogram to a screening since the diagnosis is screening.  Thanks in advance

## 2020-05-24 ENCOUNTER — Telehealth: Payer: Self-pay | Admitting: Registered Nurse

## 2020-05-24 NOTE — Telephone Encounter (Signed)
Error

## 2020-07-10 ENCOUNTER — Ambulatory Visit (AMBULATORY_SURGERY_CENTER): Payer: Self-pay

## 2020-07-10 ENCOUNTER — Other Ambulatory Visit: Payer: Self-pay

## 2020-07-10 VITALS — Ht <= 58 in | Wt 122.8 lb

## 2020-07-10 DIAGNOSIS — Z1211 Encounter for screening for malignant neoplasm of colon: Secondary | ICD-10-CM

## 2020-07-10 MED ORDER — NA SULFATE-K SULFATE-MG SULF 17.5-3.13-1.6 GM/177ML PO SOLN: 1.0000 | Freq: Once | ORAL | 0 refills | Status: AC

## 2020-07-10 NOTE — Progress Notes (Signed)
[place] interpreter present for pre visit;  [place] instructions sent home with patient;  No egg or soy allergy known to patient  No issues with past sedation with any surgeries or procedures no intubation problems in the past  No diet pills per patient No home 02 use per patient  No blood thinners per patient  Pt denies issues with constipation  No A fib or A flutter  EMMI video to pt at pre visit;  [place] 19 guidelines implemented in [place] today   Per pt report --she is not taking the Metformin  COVID vaccines completed per patient on 05/2020;  Due to the COVID-19 pandemic we are asking patients to follow these guidelines. Please only bring one care partner. Please be aware that your care partner may wait in the car in the parking lot or if they feel like they will be too hot to wait in the car, they may wait in the lobby on the 4th floor. All care partners are required to wear a mask the entire time (we do not have any that we can provide them), they need to practice social distancing, and we will do a Covid check for all patient's and care partners when you arrive. Also we will check their temperature and your temperature. If the care partner waits in their car they need to stay in the parking lot the entire time and we will call them on their cell phone when the patient is ready for discharge so they can bring the car to the front of the building. Also all patient's will need to wear a mask into building.

## 2020-07-23 ENCOUNTER — Encounter: Payer: Self-pay | Admitting: Gastroenterology

## 2020-07-23 ENCOUNTER — Other Ambulatory Visit: Payer: Self-pay

## 2020-07-23 ENCOUNTER — Ambulatory Visit (AMBULATORY_SURGERY_CENTER): Payer: 59 | Admitting: Gastroenterology

## 2020-07-23 VITALS — BP 133/91 | HR 75 | Temp 98.1°F | Resp 15 | Ht <= 58 in | Wt 122.0 lb

## 2020-07-23 DIAGNOSIS — Z1211 Encounter for screening for malignant neoplasm of colon: Secondary | ICD-10-CM

## 2020-07-23 DIAGNOSIS — D122 Benign neoplasm of ascending colon: Secondary | ICD-10-CM

## 2020-07-23 DIAGNOSIS — D129 Benign neoplasm of anus and anal canal: Secondary | ICD-10-CM

## 2020-07-23 DIAGNOSIS — D128 Benign neoplasm of rectum: Secondary | ICD-10-CM

## 2020-07-23 DIAGNOSIS — D123 Benign neoplasm of transverse colon: Secondary | ICD-10-CM

## 2020-07-23 MED ORDER — SODIUM CHLORIDE 0.9 % IV SOLN: 500.0000 mL | INTRAVENOUS | Status: DC

## 2020-07-23 NOTE — Op Note (Addendum)
[place] Patient Name: Glenda Pham Procedure Date: 07/23/2020 7:59 AM MRN: [id number] Endoscopist: Mauri Pole , MD Age: 59 Referring MD:  Date of Birth: 11/22/1961 Gender: Female Account #: [id number] Procedure:                Colonoscopy Indications:              Screening for colorectal malignant neoplasm Medicines:                Monitored Anesthesia Care Procedure:                Pre-Anesthesia Assessment:                           - Prior to the procedure, a History and Physical                            was performed, and patient medications and                            allergies were reviewed. The patient's tolerance of                            previous anesthesia was also reviewed. The risks                            and benefits of the procedure and the sedation                            options and risks were discussed with the patient.                            All questions were answered, and informed consent                            was obtained. Prior Anticoagulants: The patient has                            taken no previous anticoagulant or antiplatelet                            agents. ASA Grade Assessment: II - A patient with                            mild systemic disease. After reviewing the risks                            and benefits, the patient was deemed in                            satisfactory condition to undergo the procedure.                           After obtaining informed consent, the colonoscope  was passed under direct vision. Throughout the                            procedure, the patient's blood pressure, pulse, and                            oxygen saturations were monitored continuously. The                            Colonoscope was introduced through the anus and                            advanced to the the cecum, identified by                            appendiceal orifice and  ileocecal valve. The                            colonoscopy was performed without difficulty. The                            patient tolerated the procedure well. The quality                            of the bowel preparation was good. The ileocecal                            valve, appendiceal orifice, and rectum were                            photographed. Scope In: 8:14:49 AM Scope Out: 8:31:31 AM Scope Withdrawal Time: 0 hours 13 minutes 9 seconds  Total Procedure Duration: 0 hours 16 minutes 42 seconds  Findings:                 The perianal and digital rectal examinations were                            normal.                           Four sessile polyps were found in the transverse                            colon and ascending colon. The polyps were 1 to 2                            mm in size. These polyps were removed with a cold                            biopsy forceps. Resection and retrieval were                            complete.  A 15 mm polyp was found in the rectum. The polyp                            was semi-pedunculated. The polyp was removed with a                            hot snare. Resection and retrieval were complete.                            Biopsies were taken with a cold forceps for                            histology from nodular edematous mucosa at base of                            the polyp to exclude adenomatous tissue. Complications:            No immediate complications. Estimated Blood Loss:     Estimated blood loss was minimal. Impression:               - Four 1 to 2 mm polyps in the transverse colon and                            in the ascending colon, removed with a cold biopsy                            forceps. Resected and retrieved.                           - One 15 mm polyp in the rectum, removed with a hot                            snare. Resected and retrieved. Biopsied. Recommendation:           -  Patient has a contact number available for                            emergencies. The signs and symptoms of potential                            delayed complications were discussed with the                            patient. Return to normal activities tomorrow.                            Written discharge instructions were provided to the                            patient.                           - Resume previous diet.                           -  Continue present medications.                           - Await pathology results.                           - Repeat colonoscopy date to be determined after                            pending pathology results are reviewed for                            surveillance based on pathology results. Mauri Pole, MD 07/23/2020 8:47:43 AM This report has been signed electronically.

## 2020-07-23 NOTE — Progress Notes (Signed)
Report to PACU, RN, vss, BBS= Clear.  

## 2020-07-23 NOTE — Patient Instructions (Signed)
Please read handouts provided. Continue present medications. Await pathology results.   YOU HAD AN ENDOSCOPIC PROCEDURE TODAY AT THE [facility]:   Refer to the procedure report that was given to you for any specific questions about what was found during the examination.  If the procedure report does not answer your questions, please call your gastroenterologist to clarify.  If you requested that your care partner not be given the details of your procedure findings, then the procedure report has been included in a sealed envelope for you to review at your convenience later.  YOU SHOULD EXPECT: Some feelings of bloating in the abdomen. Passage of more gas than usual.  Walking can help get rid of the air that was put into your GI tract during the procedure and reduce the bloating. If you had a lower endoscopy (such as a colonoscopy or flexible sigmoidoscopy) you may notice spotting of blood in your stool or on the toilet paper. If you underwent a bowel prep for your procedure, you may not have a normal bowel movement for a few days.  Please Note:  You might notice some irritation and congestion in your nose or some drainage.  This is from the oxygen used during your procedure.  There is no need for concern and it should clear up in a day or so.  SYMPTOMS TO REPORT IMMEDIATELY:  Following lower endoscopy (colonoscopy or flexible sigmoidoscopy):  Excessive amounts of blood in the stool  Significant tenderness or worsening of abdominal pains  Swelling of the abdomen that is new, acute  Fever of 100F or higher   For urgent or emergent issues, a gastroenterologist can be reached at any hour by calling [phone]. Do not use MyChart messaging for urgent concerns.    DIET:  We do recommend a small meal at first, but then you may proceed to your regular diet.  Drink plenty of fluids but you should avoid alcoholic beverages for 24 hours.  ACTIVITY:  You should plan to take it easy  for the rest of today and you should NOT DRIVE or use heavy machinery until tomorrow (because of the sedation medicines used during the test).    FOLLOW UP: Our staff will call the number listed on your records 48-72 hours following your procedure to check on you and address any questions or concerns that you may have regarding the information given to you following your procedure. If we do not reach you, we will leave a message.  We will attempt to reach you two times.  During this call, we will ask if you have developed any symptoms of COVID 19. If you develop any symptoms (ie: fever, flu-like symptoms, shortness of breath, cough etc.) before then, please call [phone].  If you test positive for Covid 19 in the 2 weeks post procedure, please call and report this information to us.    If any biopsies were taken you will be contacted by phone or by letter within the next 1-3 weeks.  Please call us at [phone] if you have not heard about the biopsies in 3 weeks.    SIGNATURES/CONFIDENTIALITY: You and/or your care partner have signed paperwork which will be entered into your electronic medical record.  These signatures attest to the fact that that the information above on your After Visit Summary has been reviewed and is understood.  Full responsibility of the confidentiality of this discharge information lies with you and/or your care-partner.  

## 2020-07-23 NOTE — Progress Notes (Signed)
Vs SP I have reviewed the patient's medical history in detail and updated the computerized patient record.

## 2020-07-23 NOTE — Progress Notes (Signed)
Called to room to assist during endoscopic procedure.  Patient ID and intended procedure confirmed with present staff. Received instructions for my participation in the procedure from the performing physician.  

## 2020-07-25 ENCOUNTER — Telehealth: Payer: Self-pay

## 2020-07-25 NOTE — Telephone Encounter (Signed)
First post procedure follow up call, no answer 

## 2020-07-25 NOTE — Telephone Encounter (Signed)
Left message on answering machine. 

## 2020-07-31 ENCOUNTER — Other Ambulatory Visit: Payer: Self-pay

## 2020-07-31 ENCOUNTER — Encounter (HOSPITAL_COMMUNITY): Payer: Self-pay | Admitting: Emergency Medicine

## 2020-07-31 ENCOUNTER — Ambulatory Visit (HOSPITAL_COMMUNITY)
Admission: EM | Admit: 2020-07-31 | Discharge: 2020-07-31 | Disposition: A | Discharge status: Home / Self Care | Payer: 59 | Attending: Family Medicine | Admitting: Family Medicine

## 2020-07-31 ENCOUNTER — Ambulatory Visit (INDEPENDENT_AMBULATORY_CARE_PROVIDER_SITE_OTHER): Payer: 59

## 2020-07-31 DIAGNOSIS — M79644 Pain in right finger(s): Secondary | ICD-10-CM

## 2020-07-31 DIAGNOSIS — S6991XA Unspecified injury of right wrist, hand and finger(s), initial encounter: Secondary | ICD-10-CM

## 2020-07-31 DIAGNOSIS — W2209XA Striking against other stationary object, initial encounter: Secondary | ICD-10-CM

## 2020-07-31 MED ORDER — TRAMADOL HCL 50 MG PO TABS: 50.0000 mg | ORAL_TABLET | Freq: Four times a day (QID) | ORAL | 0 refills | Status: DC | PRN

## 2020-07-31 MED ORDER — NAPROXEN 500 MG PO TABS: 500.0000 mg | ORAL_TABLET | Freq: Two times a day (BID) | ORAL | 0 refills | Status: AC

## 2020-07-31 NOTE — Discharge Instructions (Addendum)
Fracture to middle bone of finger Wear splint for the next 4-6 weeks Follow up with sports medicine- contact below Take naprosyn twice daily for 10 days to help with swelling and pain Tramadol for severe pain May ice as needed

## 2020-07-31 NOTE — ED Triage Notes (Signed)
Patient hit middle finger on door facing accidentally.  This occurred about 8-9 days ago.  Right middle finger slightly swollen.  Patient can make a fist, but it hurts.  Touching finger tip causes pain to radiate up right arm

## 2020-07-31 NOTE — ED Provider Notes (Signed)
[facility]    CSN: [id number] Arrival date & time: 07/31/20  1545      History   Chief Complaint Chief Complaint  Patient presents with  . Hand Pain    HPI Glenda Pham is a 59 y.o. female history of hypertension, presenting today for evaluation of right middle finger injury.  Approximately 8 to 9 days ago patient accidentally hit the middle finger on a door.  Since she has had some swelling around her joint as well as reports pain especially with making fist or hyperextension of PIP.  Denies pain to touch.  Touching distally she has pain proximally into hand/arm.  Has not taken any medicines for pain.  HPI  Past Medical History:  Diagnosis Date  . Anxiety    past  . Depression   . Headache   . Hypertension     Patient Active Problem List   Diagnosis Date Noted  . Decreased visual acuity 05/07/2020  . Tachycardia   . Essential hypertension   . Influenza A 01/22/2019  . Chronic migraine 11/27/2016    Past Surgical History:  Procedure Laterality Date  . HAND SURGERY Left   . LASIK Bilateral   . TUMOR REMOVAL     Left shoulder - benign  . WISDOM TOOTH EXTRACTION      OB History    Gravida  3   Para  3   Term  3   Preterm      AB      Living        SAB      TAB      Ectopic      Multiple      Live Births               Home Medications    Prior to Admission medications   Medication Sig Start Date End Date Taking? Authorizing Provider  amLODipine (NORVASC) 2.5 MG tablet Take 1 tablet (2.5 mg total) by mouth daily. 05/07/20  Yes Maximiano Coss, NP  acetaminophen (TYLENOL) 325 MG tablet Take 325 mg by mouth daily as needed for mild pain.    [provider]  metFORMIN (GLUCOPHAGE) 500 MG tablet Take 1 tablet (500 mg total) by mouth 2 (two) times daily with a meal. Patient not taking: Reported on 05/17/2020 01/21/20   Scot Jun, FNP  naproxen (NAPROSYN) 500 MG tablet Take 1 tablet (500 mg total) by mouth 2 (two)  times daily for 10 days. 07/31/20 08/10/20  Brionne Mertz C, PA-C  traMADol (ULTRAM) 50 MG tablet Take 1 tablet (50 mg total) by mouth every 6 (six) hours as needed for severe pain. 07/31/20   Korban Shearer C, PA-C  Vitamin D, Ergocalciferol, (DRISDOL) 1.25 MG (50000 UNIT) CAPS capsule Take 1 capsule (50,000 Units total) by mouth every 7 (seven) days. Patient not taking: Reported on 07/10/2020 05/22/20   Maximiano Coss, NP  omeprazole (PRILOSEC) 40 MG capsule Take 1 daily in the evening to reduce stomach acid production. Patient not taking: Reported on 05/17/2020 01/24/20 07/31/20  Posey Boyer, MD    Family History Family History  Problem Relation Age of Onset  . Colon cancer Paternal Aunt   . Colon polyps Neg Hx   . Esophageal cancer Neg Hx   . Rectal cancer Neg Hx     Social History Social History   Tobacco Use  . Smoking status: Never Smoker  . Smokeless tobacco: Never Used  Vaping Use  . Vaping Use:  Never used  Substance Use Topics  . Alcohol use: No  . Drug use: No     Allergies   Fluoxetine hcl, Advil [ibuprofen], Metformin and related, and Vancomycin   Review of Systems Review of Systems  Constitutional: Negative for fatigue and fever.  Eyes: Negative for visual disturbance.  Respiratory: Negative for shortness of breath.   Cardiovascular: Negative for chest pain.  Gastrointestinal: Negative for abdominal pain, nausea and vomiting.  Musculoskeletal: Positive for arthralgias and joint swelling.  Skin: Negative for color change, rash and wound.  Neurological: Negative for dizziness, weakness, light-headedness and headaches.     Physical Exam Triage Vital Signs ED Triage Vitals  Enc Vitals Group     BP      Pulse      Resp      Temp      Temp src      SpO2      Weight      Height      Head Circumference      Peak Flow      Pain Score      Pain Loc      Pain Edu?      Excl. in [place]?    No data found.  Updated Vital Signs BP 132/75 (BP Location:  Left Arm)   Pulse 72   Temp 97.9 F (36.6 C) (Oral)   LMP 10/08/2014   SpO2 100%   Visual Acuity Right Eye Distance:   Left Eye Distance:   Bilateral Distance:    Right Eye Near:   Left Eye Near:    Bilateral Near:     Physical Exam Vitals and nursing note reviewed.  Constitutional:      Appearance: She is well-developed.     Comments: No acute distress  HENT:     Head: Normocephalic and atraumatic.     Nose: Nose normal.  Eyes:     Conjunctiva/sclera: Conjunctivae normal.  Cardiovascular:     Rate and Rhythm: Normal rate.  Pulmonary:     Effort: Pulmonary effort is normal. No respiratory distress.  Abdominal:     General: There is no distension.  Musculoskeletal:        General: Normal range of motion.     Cervical back: Neck supple.     Comments: Right hand: No obvious swelling deformity or discoloration, nontender to palpation along dorsum of hand into proximal middle and distal phalanx, nontender about PIP and DIP Full active range of motion at MCP joint, PIP and DIP of right middle finger  Radial pulse 2+  Skin:    General: Skin is warm and dry.  Neurological:     Mental Status: She is alert and oriented to person, place, and time.      UC Treatments / Results  Labs (all labs ordered are listed, but only abnormal results are displayed) Labs Reviewed - No data to display  EKG   Radiology DG Finger Middle Right  Result Date: 07/31/2020 CLINICAL DATA:  Hit on door EXAM: RIGHT MIDDLE FINGER 2+V COMPARISON:  None. FINDINGS: Probable acute nondisplaced fracture involving distal shaft and head of the middle phalanx. No subluxation. No radiopaque foreign body. IMPRESSION: Probable acute nondisplaced fracture involving the distal shaft and head of the middle phalanx. Electronically Signed   By: Donavan Foil M.D.   On: 07/31/2020 19:07    Procedures Procedures (including critical care time)  Medications Ordered in UC Medications - No data to  display  Initial Impression /  Assessment and Plan / UC Course  I have reviewed the triage vital signs and the nursing notes.  Pertinent labs & imaging results that were available during my care of the patient were reviewed by me and considered in my medical decision making (see chart for details).     Acute nondisplaced fracture of distal shaft of middle phalanx of right finger.  Placing an status splint and will have follow-up with sports medicine.  Naprosyn twice daily, may supplement tramadol for severe pain.  Discussed strict return precautions. Patient verbalized understanding and is agreeable with plan.  Final Clinical Impressions(s) / UC Diagnoses   Final diagnoses:  Pain of right middle finger     Discharge Instructions     Fracture to middle bone of finger Wear splint for the next 4-6 weeks Follow up with sports medicine- contact below Take naprosyn twice daily for 10 days to help with swelling and pain Tramadol for severe pain May ice as needed     ED Prescriptions    Medication Sig Dispense Auth. Provider   naproxen (NAPROSYN) 500 MG tablet Take 1 tablet (500 mg total) by mouth 2 (two) times daily for 10 days. 30 tablet Haniel Fix C, PA-C   traMADol (ULTRAM) 50 MG tablet Take 1 tablet (50 mg total) by mouth every 6 (six) hours as needed for severe pain. 12 tablet Laressa Bolinger, Fraser C, PA-C     I have reviewed the PDMP during this encounter.   Janith Lima, [place] 07/31/20 1939

## 2020-08-07 ENCOUNTER — Encounter: Payer: Self-pay | Admitting: Gastroenterology

## 2021-05-21 ENCOUNTER — Ambulatory Visit: Payer: 59 | Admitting: Nurse Practitioner

## 2021-05-21 DIAGNOSIS — Z0289 Encounter for other administrative examinations: Secondary | ICD-10-CM

## 2021-07-28 ENCOUNTER — Other Ambulatory Visit: Payer: Self-pay

## 2021-07-28 ENCOUNTER — Ambulatory Visit (HOSPITAL_COMMUNITY)
Admission: EM | Admit: 2021-07-28 | Discharge: 2021-07-28 | Disposition: A | Discharge status: Home / Self Care | Payer: 59 | Attending: Internal Medicine | Admitting: Internal Medicine

## 2021-07-28 ENCOUNTER — Encounter (HOSPITAL_COMMUNITY): Payer: Self-pay

## 2021-07-28 DIAGNOSIS — R21 Rash and other nonspecific skin eruption: Secondary | ICD-10-CM

## 2021-07-28 DIAGNOSIS — L509 Urticaria, unspecified: Secondary | ICD-10-CM

## 2021-07-28 DIAGNOSIS — L239 Allergic contact dermatitis, unspecified cause: Secondary | ICD-10-CM | POA: Diagnosis not present

## 2021-07-28 MED ORDER — CETIRIZINE HCL 10 MG PO TABS: 10.0000 mg | ORAL_TABLET | Freq: Every day | ORAL | 0 refills | Status: AC

## 2021-07-28 MED ORDER — PREDNISONE 20 MG PO TABS: 40.0000 mg | ORAL_TABLET | Freq: Every day | ORAL | 0 refills | Status: AC

## 2021-07-28 NOTE — ED Provider Notes (Addendum)
[facility]    CSN: [id number] Arrival date & time: 07/28/21  1006      History   Chief Complaint Chief Complaint  Patient presents with   Rash    HPI Glenda Pham is a 60 y.o. female.   Patient presents the urgent care with 2-week history of rash that is present to her abdomen and bilateral legs.  Patient states that rash started immediately after going out to nail salon to have a pedicure.  Denies any recent changes to lotions, soaps, detergents, foods, etc.  Denies any recent travel.  Patient states that rash is itchy.  Patient went to the doctor approximately 3 to 4 days ago and was given a "injection" along with hydroxyzine as needed for itching with no improvement.  Hydroxyzine has made patient sleepy and she does not like this.  Patient is not sure what the "injection" was.    Rash  Past Medical History:  Diagnosis Date   Anxiety    past   Depression    Headache    Hypertension     Patient Active Problem List   Diagnosis Date Noted   Decreased visual acuity 05/07/2020   Tachycardia    Essential hypertension    Influenza A 01/22/2019   Chronic migraine 11/27/2016    Past Surgical History:  Procedure Laterality Date   HAND SURGERY Left    LASIK Bilateral    TUMOR REMOVAL     Left shoulder - benign   WISDOM TOOTH EXTRACTION      OB History     Gravida  3   Para  3   Term  3   Preterm      AB      Living         SAB      IAB      Ectopic      Multiple      Live Births               Home Medications    Prior to Admission medications   Medication Sig Start Date End Date Taking? Authorizing Provider  cetirizine (ZYRTEC) 10 MG tablet Take 1 tablet (10 mg total) by mouth daily. 07/28/21  Yes Odis Luster, FNP  predniSONE (DELTASONE) 20 MG tablet Take 2 tablets (40 mg total) by mouth daily for 5 days. 07/28/21 08/02/21 Yes Odis Luster, FNP  acetaminophen (TYLENOL) 325 MG tablet Take 325 mg by mouth daily as needed for  mild pain.    [provider]  amLODipine (NORVASC) 2.5 MG tablet Take 1 tablet (2.5 mg total) by mouth daily. 05/07/20   Maximiano Coss, NP  metFORMIN (GLUCOPHAGE) 500 MG tablet Take 1 tablet (500 mg total) by mouth 2 (two) times daily with a meal. Patient not taking: Reported on 05/17/2020 01/21/20   Scot Jun, FNP  traMADol (ULTRAM) 50 MG tablet Take 1 tablet (50 mg total) by mouth every 6 (six) hours as needed for severe pain. 07/31/20   Wieters, Hallie C, PA-C  Vitamin D, Ergocalciferol, (DRISDOL) 1.25 MG (50000 UNIT) CAPS capsule Take 1 capsule (50,000 Units total) by mouth every 7 (seven) days. Patient not taking: Reported on 07/10/2020 05/22/20   Maximiano Coss, NP  omeprazole (PRILOSEC) 40 MG capsule Take 1 daily in the evening to reduce stomach acid production. Patient not taking: Reported on 05/17/2020 01/24/20 07/31/20  Posey Boyer, MD    Family History Family History  Problem Relation Age of Onset  Colon cancer Paternal Aunt    Colon polyps Neg Hx    Esophageal cancer Neg Hx    Rectal cancer Neg Hx     Social History Social History   Tobacco Use   Smoking status: Never   Smokeless tobacco: Never  Vaping Use   Vaping Use: Never used  Substance Use Topics   Alcohol use: No   Drug use: No     Allergies   Fluoxetine hcl, Advil [ibuprofen], Metformin and related, and Vancomycin   Review of Systems Review of Systems  Skin:  Positive for rash.  Per HPI  Physical Exam Triage Vital Signs ED Triage Vitals  Enc Vitals Group     BP 07/28/21 1046 138/66     Pulse Rate 07/28/21 1046 88     Resp 07/28/21 1046 16     Temp 07/28/21 1046 98.9 F (37.2 C)     Temp Source 07/28/21 1046 Oral     SpO2 07/28/21 1046 99 %     Weight --      Height --      Head Circumference --      Peak Flow --      Pain Score 07/28/21 1047 0     Pain Loc --      Pain Edu? --      Excl. in [place]? --    No data found.  Updated Vital Signs BP 138/66 (BP Location: Left  Arm)   Pulse 88   Temp 98.9 F (37.2 C) (Oral)   Resp 16   LMP 10/08/2014   SpO2 99%   Visual Acuity Right Eye Distance:   Left Eye Distance:   Bilateral Distance:    Right Eye Near:   Left Eye Near:    Bilateral Near:     Physical Exam Exam conducted with a chaperone present.  Constitutional:      Appearance: Normal appearance.  HENT:     Head: Normocephalic and atraumatic.  Eyes:     Extraocular Movements: Extraocular movements intact.     Conjunctiva/sclera: Conjunctivae normal.  Pulmonary:     Effort: Pulmonary effort is normal.  Skin:    General: Skin is warm and dry.     Findings: Rash present. Rash is urticarial.     Comments: Large, round, erythematous urticarial lesions scattered throughout abdomen and bilateral inner thighs.  Neurological:     General: No focal deficit present.     Mental Status: She is alert and oriented to person, place, and time. Mental status is at baseline.  Psychiatric:        Mood and Affect: Mood normal.        Behavior: Behavior normal.        Thought Content: Thought content normal.        Judgment: Judgment normal.     UC Treatments / Results  Labs (all labs ordered are listed, but only abnormal results are displayed) Labs Reviewed - No data to display  EKG   Radiology No results found.  Procedures Procedures (including critical care time)  Medications Ordered in UC Medications - No data to display  Initial Impression / Assessment and Plan / UC Course  I have reviewed the triage vital signs and the nursing notes.  Pertinent labs & imaging results that were available during my care of the patient were reviewed by me and considered in my medical decision making (see chart for details).     Patient advised to stop taking hydroxyzine and  to start cetirizine daily.  Prednisone prescribed x5 days.  Patient declines history of diabetes and states that she is no longer taking metformin.  Patient advised to monitor blood  glucose at least twice daily just in case.  Patient to follow-up with PCP if rash does not improve. Discussed strict return precautions. Patient verbalized understanding and is agreeable with plan.  Interpreter used throughout patient interaction. Final Clinical Impressions(s) / UC Diagnoses   Final diagnoses:  Rash  Urticaria  Allergic contact dermatitis, unspecified trigger     Discharge Instructions      Please do not take any over-the-counter ibuprofen, Advil, Aleve while taking prednisone.  Please monitor blood glucose at least twice daily while on steroid.  Please follow-up with your primary care physician if rash does not resolve with this current treatment plan.     ED Prescriptions     Medication Sig Dispense Auth. Provider   predniSONE (DELTASONE) 20 MG tablet Take 2 tablets (40 mg total) by mouth daily for 5 days. 10 tablet Odis Luster, FNP   cetirizine (ZYRTEC) 10 MG tablet Take 1 tablet (10 mg total) by mouth daily. 30 tablet Odis Luster, FNP      PDMP not reviewed this encounter.   Odis Luster, FNP 07/28/21 [place], [place], FNP 07/28/21 [phone]

## 2021-07-28 NOTE — Discharge Instructions (Addendum)
Please do not take any over-the-counter ibuprofen, Advil, Aleve while taking prednisone.  Please monitor blood glucose at least twice daily while on steroid.  Please follow-up with your primary care physician if rash does not resolve with this current treatment plan.

## 2021-07-28 NOTE — ED Triage Notes (Signed)
Pt present rash located on her abdomen and legs and arms. Pt states the rash has been there for two weeks. She is experiencing itching from the rash

## 2022-05-18 ENCOUNTER — Encounter (HOSPITAL_COMMUNITY): Payer: Self-pay | Admitting: Emergency Medicine

## 2022-05-18 ENCOUNTER — Ambulatory Visit (HOSPITAL_COMMUNITY)
Admission: EM | Admit: 2022-05-18 | Discharge: 2022-05-18 | Disposition: A | Discharge status: Home / Self Care | Payer: 59 | Attending: Physician Assistant | Admitting: Physician Assistant

## 2022-05-18 ENCOUNTER — Ambulatory Visit (INDEPENDENT_AMBULATORY_CARE_PROVIDER_SITE_OTHER): Payer: 59

## 2022-05-18 ENCOUNTER — Other Ambulatory Visit: Payer: Self-pay

## 2022-05-18 DIAGNOSIS — M5030 Other cervical disc degeneration, unspecified cervical region: Secondary | ICD-10-CM | POA: Diagnosis not present

## 2022-05-18 DIAGNOSIS — M25511 Pain in right shoulder: Secondary | ICD-10-CM | POA: Diagnosis not present

## 2022-05-18 DIAGNOSIS — M5412 Radiculopathy, cervical region: Secondary | ICD-10-CM | POA: Diagnosis not present

## 2022-05-18 DIAGNOSIS — M79601 Pain in right arm: Secondary | ICD-10-CM

## 2022-05-18 MED ORDER — BACLOFEN 5 MG PO TABS: 10.0000 mg | ORAL_TABLET | Freq: Two times a day (BID) | ORAL | 0 refills | Status: AC | PRN

## 2022-05-18 NOTE — ED Notes (Signed)
Extensive discussion with translator and patient to get questions answered

## 2022-05-18 NOTE — ED Triage Notes (Addendum)
Right arm pain for 2 weeks.  Right arm pain, particularly when lifting right arm.  Pain causes difficulty breathing.  Pain is intermittent.    Tylenol helps this pain.    Patient points to right shoulder, right chest, right axilla and right upper back.     Patient has had similar pain in the past  Patient is right handed.  Patient reports her father had the same pain and eventually had paralysis in his arm

## 2022-05-18 NOTE — Discharge Instructions (Signed)
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  Please start baclofen twice daily.  This to make you sleepy so do not drive or drink alcohol with taking it.  Use heat, rest, stretch for additional symptom relief.  You can use Tylenol for pain.  I recommend you follow-up with an orthopedic/worse medicine provider.  Please call to schedule an appointment.  If anything worsens and you have weakness, numbness, tingling sensation, severe pain, fever you should be seen immediately.

## 2022-05-18 NOTE — ED Provider Notes (Signed)
[place]    CSN: [id number] Arrival date & time: 05/18/22  1005      History   Chief Complaint Chief Complaint  Patient presents with   Arm Pain    HPI Glenda Pham is a 61 y.o. female.   Patient presents today with a 2-week history of worsening right neck/shoulder/arm pain.  Patient is [place] speaking and video interpreter was utilized for visit.  She denies any known injury or increase in activity prior to symptom onset.  She reports pain is rated 10 on a 0-10 pain scale, localized to neck/shoulder with radiation into upper arm, described as intense aching, no aggravating alleviating factors identified.  She has tried Tylenol without improvement.  She denies any previous neck or right shoulder surgery.  She does report having a benign mass removed from left shoulder but states current symptoms are not similar to previous episodes of this condition.  She denies history of malignancy.  She denies any numbness or paresthesias involving her right hand.  She is right-handed.  She is very concerned as her father also had similar symptoms and then ultimately ended up paralyzed so she is anxious to determine the cause of her pain.  She denies any systemic symptoms including fever, nausea, vomiting, body aches.   Past Medical History:  Diagnosis Date   Anxiety    past   Depression    Headache    Hypertension     Patient Active Problem List   Diagnosis Date Noted   Decreased visual acuity 05/07/2020   Tachycardia    Essential hypertension    Influenza A 01/22/2019   Chronic migraine 11/27/2016    Past Surgical History:  Procedure Laterality Date   HAND SURGERY Left    LASIK Bilateral    TUMOR REMOVAL     Left shoulder - benign   WISDOM TOOTH EXTRACTION      OB History     Gravida  3   Para  3   Term  3   Preterm      AB      Living         SAB      IAB      Ectopic      Multiple      Live Births               Home Medications     Prior to Admission medications   Medication Sig Start Date End Date Taking? Authorizing Provider  baclofen 5 MG TABS Take 10 mg by mouth 2 (two) times daily as needed for muscle spasms. 05/18/22  Yes Nikola [place], Derry Skill, PA-C  acetaminophen (TYLENOL) 325 MG tablet Take 325 mg by mouth daily as needed for mild pain.    [provider]  amLODipine (NORVASC) 2.5 MG tablet Take 1 tablet (2.5 mg total) by mouth daily. 05/07/20   Maximiano Coss, NP  cetirizine (ZYRTEC) 10 MG tablet Take 1 tablet (10 mg total) by mouth daily. Patient not taking: Reported on 05/18/2022 07/28/21   Teodora Medici, FNP  metFORMIN (GLUCOPHAGE) 500 MG tablet Take 1 tablet (500 mg total) by mouth 2 (two) times daily with a meal. Patient not taking: Reported on 05/17/2020 01/21/20   Scot Jun, FNP  Vitamin D, Ergocalciferol, (DRISDOL) 1.25 MG (50000 UNIT) CAPS capsule Take 1 capsule (50,000 Units total) by mouth every 7 (seven) days. Patient not taking: Reported on 07/10/2020 05/22/20   Maximiano Coss, NP  omeprazole ([place]) 40  MG capsule Take 1 daily in the evening to reduce stomach acid production. Patient not taking: Reported on 05/17/2020 01/24/20 07/31/20  Posey Boyer, MD    Family History Family History  Problem Relation Age of Onset   Stroke Mother    Colon cancer Paternal Aunt    Colon polyps Neg Hx    Esophageal cancer Neg Hx    Rectal cancer Neg Hx     Social History Social History   Tobacco Use   Smoking status: Never   Smokeless tobacco: Never  Vaping Use   Vaping Use: Never used  Substance Use Topics   Alcohol use: No   Drug use: No     Allergies   Fluoxetine hcl, Advil [ibuprofen], Metformin and related, and Vancomycin   Review of Systems Review of Systems  Constitutional:  Positive for activity change. Negative for appetite change, fatigue and fever.  Gastrointestinal:  Negative for abdominal pain, diarrhea, nausea and vomiting.  Musculoskeletal:  Positive for arthralgias  and neck pain. Negative for myalgias.  Neurological:  Negative for dizziness, weakness, light-headedness, numbness and headaches.    Physical Exam Triage Vital Signs ED Triage Vitals  Enc Vitals Group     BP 05/18/22 1045 135/89     Pulse Rate 05/18/22 1045 87     Resp 05/18/22 1045 18     Temp 05/18/22 1045 99 F (37.2 C)     Temp Source 05/18/22 1045 Oral     SpO2 05/18/22 1045 97 %     Weight --      Height --      Head Circumference --      Peak Flow --      Pain Score 05/18/22 1035 10     Pain Loc --      Pain Edu? --      Excl. in [place]? --    No data found.  Updated Vital Signs BP 135/89 (BP Location: Right Arm)   Pulse 87   Temp 99 F (37.2 C) (Oral)   Resp 18   LMP 10/08/2014   SpO2 97%   Visual Acuity Right Eye Distance:   Left Eye Distance:   Bilateral Distance:    Right Eye Near:   Left Eye Near:    Bilateral Near:     Physical Exam Vitals reviewed.  Constitutional:      General: She is awake. She is not in acute distress.    Appearance: Normal appearance. She is well-developed. She is not ill-appearing.     Comments: Very pleasant female appears stated age in no acute distress sitting comfortably on exam table  HENT:     Head: Normocephalic and atraumatic.  Cardiovascular:     Rate and Rhythm: Normal rate and regular rhythm.     Heart sounds: Normal heart sounds, S1 normal and S2 normal. No murmur heard. Pulmonary:     Effort: Pulmonary effort is normal.     Breath sounds: Normal breath sounds. No wheezing, rhonchi or rales.     Comments: Clear to auscultation bilaterally Abdominal:     Palpations: Abdomen is soft.     Tenderness: There is no abdominal tenderness.  Musculoskeletal:     Right shoulder: Tenderness and bony tenderness present. No swelling. Normal range of motion. Normal strength.     Right upper arm: No swelling, tenderness or bony tenderness.     Cervical back: Tenderness and bony tenderness present. No spasms. Normal range of  motion.  Thoracic back: No tenderness or bony tenderness.     Lumbar back: No tenderness or bony tenderness.     Comments: Right shoulder: Decreased range of motion with overhead flexion as well as internal rotation and external rotation.  Negative empty can and drop arm.  Strength 5/5.  Tenderness palpation over AC joint without deformity.  Neck: Pain percussion of vertebrae.  No deformity or step-off noted.  No significant tenderness palpation over trapezius or paraspinal muscles.  Psychiatric:        Behavior: Behavior is cooperative.     UC Treatments / Results  Labs (all labs ordered are listed, but only abnormal results are displayed) Labs Reviewed - No data to display  EKG   Radiology DG Cervical Spine Complete  Result Date: 05/18/2022 CLINICAL DATA:  Right shoulder and arm pain for 2 weeks. EXAM: CERVICAL SPINE - COMPLETE 4+ VIEW COMPARISON:  Chest radiograph dated 02/16/2019. FINDINGS: There is no evidence of cervical spine fracture or prevertebral soft tissue swelling. Alignment is normal. Mild multilevel degenerative disc disease is seen in the mid and lower cervical spine. No significant neuroforaminal stenosis. IMPRESSION: Mild multilevel degenerative disc disease. Electronically Signed   By: Zerita Boers M.D.   On: 05/18/2022 11:37   DG Shoulder Right  Result Date: 05/18/2022 CLINICAL DATA:  Right shoulder and arm pain for 2 weeks. EXAM: RIGHT SHOULDER - 2+ VIEW COMPARISON:  Chest radiograph dated 02/16/2019 FINDINGS: There is no evidence of fracture or dislocation. There is no evidence of arthropathy or other focal bone abnormality. Soft tissues are unremarkable. IMPRESSION: Negative. Electronically Signed   By: Zerita Boers M.D.   On: 05/18/2022 11:37    Procedures Procedures (including critical care time)  Medications Ordered in UC Medications - No data to display  Initial Impression / Assessment and Plan / UC Course  I have reviewed the triage vital signs and  the nursing notes.  Pertinent labs & imaging results that were available during my care of the patient were reviewed by me and considered in my medical decision making (see chart for details).     X-ray of neck and shoulder were obtained given bony tenderness and severe pain which showed degenerative changes in cervical vertebrae otherwise was normal.  Discussed that symptoms are likely related to degenerative disc disease with associated radiculopathy.  Patient is unable to take NSAIDs so we will use Tylenol for pain relief.  She was started on baclofen to be used up to twice a day as needed with instruction not to drive or drink alcohol with taking this medication.  Recommended conservative treatment measures including heat, rest, stretch.  Discussed that ultimately she would likely benefit from physical therapy and/or advanced imaging and recommended that she follow-up with sports medicine/orthopedics.  She was given contact information for local provider with instruction to call to schedule an appointment soon as possible.  Discussed that if she has any worsening symptoms she is to return for further evaluation to which she expressed understanding.  Final Clinical Impressions(s) / UC Diagnoses   Final diagnoses:  DDD (degenerative disc disease), cervical  Right arm pain  Cervical radiculopathy     Discharge Instructions      Your x-ray showed degeneration in your neck which could be causing your arm pain due to pinched nerve.  Please start baclofen twice daily.  This to make you sleepy so do not drive or drink alcohol with taking it.  Use heat, rest, stretch for additional symptom relief.  You  can use Tylenol for pain.  I recommend you follow-up with an orthopedic/worse medicine provider.  Please call to schedule an appointment.  If anything worsens and you have weakness, numbness, tingling sensation, severe pain, fever you should be seen immediately.     ED Prescriptions      Medication Sig Dispense Auth. Provider   baclofen 5 MG TABS Take 10 mg by mouth 2 (two) times daily as needed for muscle spasms. 20 tablet Kayna Suppa, Derry Skill, PA-C      PDMP not reviewed this encounter.   Terrilee Croak, PA-C 05/18/22 1151

## 2022-05-29 HISTORY — PX: LIPOSUCTION: SHX10

## 2022-09-20 ENCOUNTER — Encounter (HOSPITAL_COMMUNITY): Payer: Self-pay

## 2022-09-20 ENCOUNTER — Ambulatory Visit (HOSPITAL_COMMUNITY)
Admission: RE | Admit: 2022-09-20 | Discharge: 2022-09-20 | Disposition: A | Discharge status: Home / Self Care | Payer: Commercial Managed Care - HMO | Source: Ambulatory Visit | Attending: Physician Assistant | Admitting: Physician Assistant

## 2022-09-20 VITALS — BP 139/99 | HR 105 | Temp 98.8°F | Resp 16

## 2022-09-20 DIAGNOSIS — I10 Essential (primary) hypertension: Secondary | ICD-10-CM | POA: Diagnosis not present

## 2022-09-20 DIAGNOSIS — K2100 Gastro-esophageal reflux disease with esophagitis, without bleeding: Secondary | ICD-10-CM

## 2022-09-20 LAB — CBC WITH DIFFERENTIAL/PLATELET
Abs Immature Granulocytes: 0.02 10*3/uL (ref 0.00–0.07)
Basophils Absolute: 0.1 10*3/uL (ref 0.0–0.1)
Basophils Relative: 1 %
Eosinophils Absolute: 0.1 10*3/uL (ref 0.0–0.5)
Eosinophils Relative: 2 %
HCT: 41.6 % (ref 36.0–46.0)
Hemoglobin: 13.8 g/dL (ref 12.0–15.0)
Immature Granulocytes: 0 %
Lymphocytes Relative: 42 %
Lymphs Abs: 3.1 10*3/uL (ref 0.7–4.0)
MCH: 30.1 pg (ref 26.0–34.0)
MCHC: 33.2 g/dL (ref 30.0–36.0)
MCV: 90.6 fL (ref 80.0–100.0)
Monocytes Absolute: 0.6 10*3/uL (ref 0.1–1.0)
Monocytes Relative: 8 %
Neutro Abs: 3.5 10*3/uL (ref 1.7–7.7)
Neutrophils Relative %: 47 %
Platelets: 274 10*3/uL (ref 150–400)
RBC: 4.59 MIL/uL (ref 3.87–5.11)
RDW: 11.9 % (ref 11.5–15.5)
WBC: 7.3 10*3/uL (ref 4.0–10.5)
nRBC: 0 % (ref 0.0–0.2)

## 2022-09-20 MED ORDER — AMLODIPINE BESYLATE 2.5 MG PO TABS: 2.5000 mg | ORAL_TABLET | Freq: Every day | ORAL | 0 refills | Status: AC

## 2022-09-20 MED ORDER — LIDOCAINE VISCOUS HCL 2 % MT SOLN
OROMUCOSAL | Status: AC
  Filled 2022-09-20: qty 15

## 2022-09-20 MED ORDER — SUCRALFATE 1 G PO TABS: 1.0000 g | ORAL_TABLET | Freq: Three times a day (TID) | ORAL | 0 refills | Status: AC

## 2022-09-20 MED ORDER — LIDOCAINE VISCOUS HCL 2 % MT SOLN
15.0000 mL | Freq: Once | OROMUCOSAL | Status: AC
  Administered 2022-09-20: 15 mL via ORAL

## 2022-09-20 MED ORDER — ALUM & MAG HYDROXIDE-SIMETH 200-200-20 MG/5ML PO SUSP
30.0000 mL | Freq: Once | ORAL | Status: AC
  Administered 2022-09-20: 30 mL via ORAL

## 2022-09-20 MED ORDER — ALUM & MAG HYDROXIDE-SIMETH 200-200-20 MG/5ML PO SUSP
ORAL | Status: AC
  Filled 2022-09-20: qty 30

## 2022-09-20 MED ORDER — PANTOPRAZOLE SODIUM 40 MG PO TBEC: 40.0000 mg | DELAYED_RELEASE_TABLET | Freq: Every day | ORAL | 1 refills | Status: AC

## 2022-09-20 NOTE — ED Triage Notes (Signed)
Pt reports having liposuction 45-month ago. She is c/o acid reflux symptoms.

## 2022-09-20 NOTE — ED Provider Notes (Addendum)
Glenda Pham    CSN: [id number] Arrival date & time: 09/20/22  1018      History   Chief Complaint Chief Complaint  Patient presents with   Gastroesophageal Reflux    HPI Glenda Pham is a 61 y.o. female.   Patient presents today companied by her daughter who help provide the majority of history as well as translation.  Reports that she returned a few weeks ago from being in [place] for several months.  During that time she had liposuction and was having some abdominal pain.  She was evaluated by multiple hospitals in [place] who ultimately attributed this to GERD.  She was given some medication that provided relief but she cannot member the name of it.  She is not taking that medication currently.  She reports pain is rated 4 on a 0-10 pain scale, described as burning, no aggravating or alleviating factors identified.  Denies history of gastrointestinal disorder.  Denies history of gastric bypass or hiatal hernia.  She does not take NSAIDs regularly.  She denies any regular alcohol consumption.  Does report associated early satiety but denies history of H. pylori.  She does not take GLP-1 agonist.    Past Medical History:  Diagnosis Date   Anxiety    past   Depression    Headache    Hypertension     Patient Active Problem List   Diagnosis Date Noted   Decreased visual acuity 05/07/2020   Tachycardia    Essential hypertension    Influenza A 01/22/2019   Chronic migraine 11/27/2016    Past Surgical History:  Procedure Laterality Date   HAND SURGERY Left    LASIK Bilateral    LIPOSUCTION N/A 05/2022   TUMOR REMOVAL     Left shoulder - benign   WISDOM TOOTH EXTRACTION      OB History     Gravida  3   Para  3   Term  3   Preterm      AB      Living         SAB      IAB      Ectopic      Multiple      Live Births               Home Medications    Prior to Admission medications   Medication Sig Start Date End Date Taking?  Authorizing Provider  pantoprazole (PROTONIX) 40 MG tablet Take 1 tablet (40 mg total) by mouth daily. 09/20/22  Yes Zi Newbury K, PA-C  sucralfate (CARAFATE) 1 g tablet Take 1 tablet (1 g total) by mouth 3 (three) times daily before meals. 09/20/22  Yes Annasophia Crocker, Derry Skill, PA-C  acetaminophen (TYLENOL) 325 MG tablet Take 325 mg by mouth daily as needed for mild pain.    [provider]  amLODipine (NORVASC) 2.5 MG tablet Take 1 tablet (2.5 mg total) by mouth daily. 09/20/22   Kresha Abelson, Derry Skill, PA-C  baclofen 5 MG TABS Take 10 mg by mouth 2 (two) times daily as needed for muscle spasms. 05/18/22   Brylie Sneath, Derry Skill, PA-C  cetirizine (ZYRTEC) 10 MG tablet Take 1 tablet (10 mg total) by mouth daily. Patient not taking: Reported on 05/18/2022 07/28/21   Teodora Medici, FNP  metFORMIN (GLUCOPHAGE) 500 MG tablet Take 1 tablet (500 mg total) by mouth 2 (two) times daily with a meal. Patient not taking: Reported on 05/17/2020 01/21/20   Molli Barrows  S, FNP  Vitamin D, Ergocalciferol, (DRISDOL) 1.25 MG (50000 UNIT) CAPS capsule Take 1 capsule (50,000 Units total) by mouth every 7 (seven) days. Patient not taking: Reported on 07/10/2020 05/22/20   Maximiano Coss, NP  omeprazole (PRILOSEC) 40 MG capsule Take 1 daily in the evening to reduce stomach acid production. Patient not taking: Reported on 05/17/2020 01/24/20 07/31/20  Posey Boyer, MD    Family History Family History  Problem Relation Age of Onset   Stroke Mother    Colon cancer Paternal Aunt    Colon polyps Neg Hx    Esophageal cancer Neg Hx    Rectal cancer Neg Hx     Social History Social History   Tobacco Use   Smoking status: Never   Smokeless tobacco: Never  Vaping Use   Vaping Use: Never used  Substance Use Topics   Alcohol use: No   Drug use: No     Allergies   Fluoxetine hcl, Advil [ibuprofen], Metformin and related, and Vancomycin   Review of Systems Review of Systems  Constitutional:  Positive for activity change.  Negative for appetite change, fatigue and fever.  Respiratory:  Negative for cough and shortness of breath.   Cardiovascular:  Negative for chest pain.  Gastrointestinal:  Positive for abdominal pain and nausea. Negative for blood in stool, constipation, diarrhea and vomiting.  Neurological:  Negative for dizziness, weakness, light-headedness and headaches.     Physical Exam Triage Vital Signs ED Triage Vitals [09/20/22 1048]  Enc Vitals Group     BP (!) 139/99     Pulse Rate (!) 105     Resp 16     Temp 98.8 F (37.1 C)     Temp Source Oral     SpO2 97 %     Weight      Height      Head Circumference      Peak Flow      Pain Score      Pain Loc      Pain Edu?      Excl. in [place]?    No data found.  Updated Vital Signs BP (!) 139/99 (BP Location: Left Arm)   Pulse (!) 105   Temp 98.8 F (37.1 C) (Oral)   Resp 16   LMP 10/08/2014   SpO2 97%   Visual Acuity Right Eye Distance:   Left Eye Distance:   Bilateral Distance:    Right Eye Near:   Left Eye Near:    Bilateral Near:     Physical Exam Vitals reviewed.  Constitutional:      General: She is awake. She is not in acute distress.    Appearance: Normal appearance. She is well-developed. She is not ill-appearing.     Comments: Very pleasant female appears stated age in no acute distress sitting comfortably in exam room  HENT:     Head: Normocephalic and atraumatic.  Cardiovascular:     Rate and Rhythm: Normal rate and regular rhythm.     Heart sounds: Normal heart sounds, S1 normal and S2 normal. No murmur heard. Pulmonary:     Effort: Pulmonary effort is normal.     Breath sounds: Normal breath sounds. No wheezing, rhonchi or rales.     Comments: Clear to auscultation bilaterally Abdominal:     General: Bowel sounds are normal.     Palpations: Abdomen is soft.     Tenderness: There is abdominal tenderness in the epigastric area. There is no right CVA  tenderness, left CVA tenderness, guarding or rebound.      Comments: Tenderness in epigastrium.  No evidence of acute abdomen on physical exam.  Psychiatric:        Behavior: Behavior is cooperative.      UC Treatments / Results  Labs (all labs ordered are listed, but only abnormal results are displayed) Labs Reviewed  CBC WITH DIFFERENTIAL/PLATELET  COMPREHENSIVE METABOLIC PANEL  LIPASE, BLOOD    EKG   Radiology No results found.  Procedures Procedures (including critical care time)  Medications Ordered in UC Medications  alum & mag hydroxide-simeth (MAALOX/MYLANTA) 200-200-20 MG/5ML suspension 30 mL (30 mLs Oral Given 09/20/22 1132)    And  lidocaine (XYLOCAINE) 2 % viscous mouth solution 15 mL (15 mLs Oral Given 09/20/22 1132)    Initial Impression / Assessment and Plan / UC Course  I have reviewed the triage vital signs and the nursing notes.  Pertinent labs & imaging results that were available during my care of the patient were reviewed by me and considered in my medical decision making (see chart for details).     Vital signs and physical exam reassuring today; no indication for emergent evaluation or imaging.  Given epigastric abdominal pain CBC, CMP, lipase were obtained-results pending.  We will contact her if any of this is abnormal.  She was given GI cocktail in clinic with improvement of pain symptoms.  Discussed acid reflux/gastritis as likely etiology of symptoms.  She was started on Protonix with instruction to take this on empty stomach.  Can use over-the-counter antacids for additional symptom relief.  She was also started on Carafate.  Recommended that she avoid spicy/acidic/fatty foods.  She is to avoid alcohol and NSAIDs.  Recommended she follow-up with GI specialist and was given contact information for local provider.  She does not currently have a primary care provider so we will try to establish her well with one via PCP assistance.  Discussed that if she has any worsening symptoms she needs to be seen  immediately including severe abdominal pain, melena, hematochezia, fever, nausea/vomiting interfere with oral intake, weakness.  Strict return precautions given to which patient and daughter expressed understanding.  At the end of visit patient's daughter requested a refill of her blood pressure medication.  This was sent to pharmacy per their request.  We are trying to establish them with a primary care provider via PCP assistance.  Discussed that if she has any difficulty establishing with a PCP she can return here for blood pressure monitoring until she is able to be seen.  Final Clinical Impressions(s) / UC Diagnoses   Final diagnoses:  Gastroesophageal reflux disease with esophagitis, unspecified whether hemorrhage     Discharge Instructions      I am glad she is feeling better after the medication.  She should start Carafate before each meal for the next 10 days.  She can continue over-the-counter antacids.  Please start Protonix daily.  This should be given on an empty stomach (30 minutes before meal or 2 hours after).  She should avoid alcohol as well as NSAIDs (aspirin, ibuprofen/Advil, naproxen/Aleve).  If her symptoms or not improving quickly she should follow-up with a stomach specialist; call to schedule an appointment.  We will contact you for lab work is abnormal.  If anything changes and she has severe abdominal pain, blood in her stool, dark stools, nausea/vomiting interfering with oral intake, weakness she needs to go to the emergency room.     ED Prescriptions  Medication Sig Dispense Auth. Provider   sucralfate (CARAFATE) 1 g tablet Take 1 tablet (1 g total) by mouth 3 (three) times daily before meals. 30 tablet Chosen Garron K, PA-C   pantoprazole (PROTONIX) 40 MG tablet Take 1 tablet (40 mg total) by mouth daily. 30 tablet Aaniyah Strohm K, PA-C   amLODipine (NORVASC) 2.5 MG tablet Take 1 tablet (2.5 mg total) by mouth daily. 90 tablet Oluwademilade Mckiver, Derry Skill, PA-C      PDMP  not reviewed this encounter.   Terrilee Croak, PA-C 09/20/22 1157    Greyson Peavy, Derry Skill, PA-C 09/20/22 1202

## 2022-09-20 NOTE — Discharge Instructions (Signed)
I am glad she is feeling better after the medication.  She should start Carafate before each meal for the next 10 days.  She can continue over-the-counter antacids.  Please start Protonix daily.  This should be given on an empty stomach (30 minutes before meal or 2 hours after).  She should avoid alcohol as well as NSAIDs (aspirin, ibuprofen/Advil, naproxen/Aleve).  If her symptoms or not improving quickly she should follow-up with a stomach specialist; call to schedule an appointment.  We will contact you for lab work is abnormal.  If anything changes and she has severe abdominal pain, blood in her stool, dark stools, nausea/vomiting interfering with oral intake, weakness she needs to go to the emergency room.

## 2023-01-18 ENCOUNTER — Ambulatory Visit (HOSPITAL_COMMUNITY): Payer: Self-pay

## 2023-10-22 IMAGING — DX DG SHOULDER 2+V*R*
3 series · 3 of 3 positions shown · non-contrast
Comparison: Chest radiograph dated 02/16/2019

CLINICAL DATA: Right shoulder and arm pain for 2 weeks.

EXAM:
RIGHT SHOULDER - 2+ VIEW

[shoulder grashey]
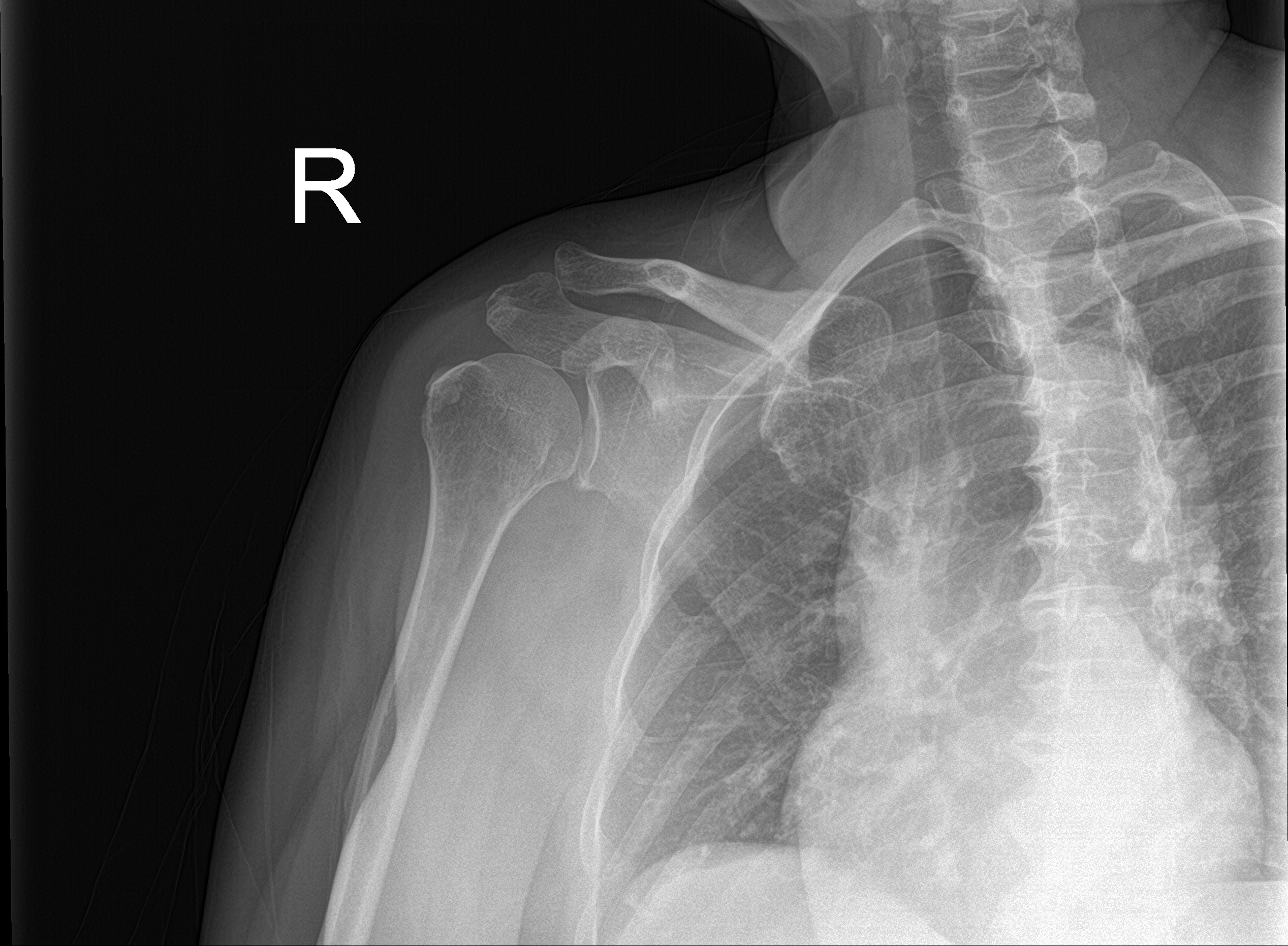

[shoulder y-view]
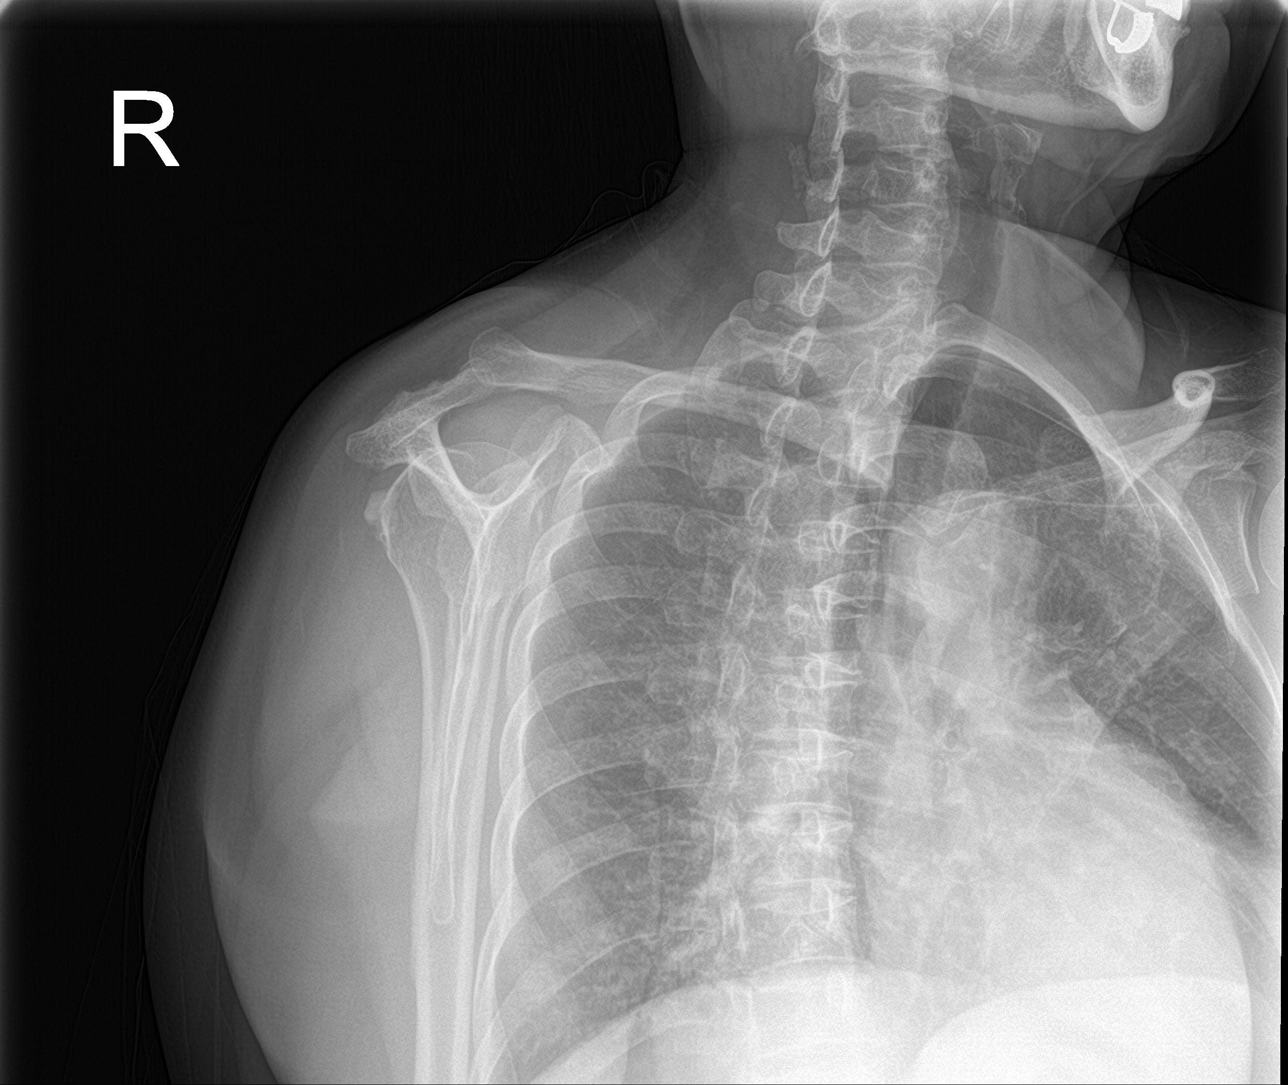

[shoulder axial]
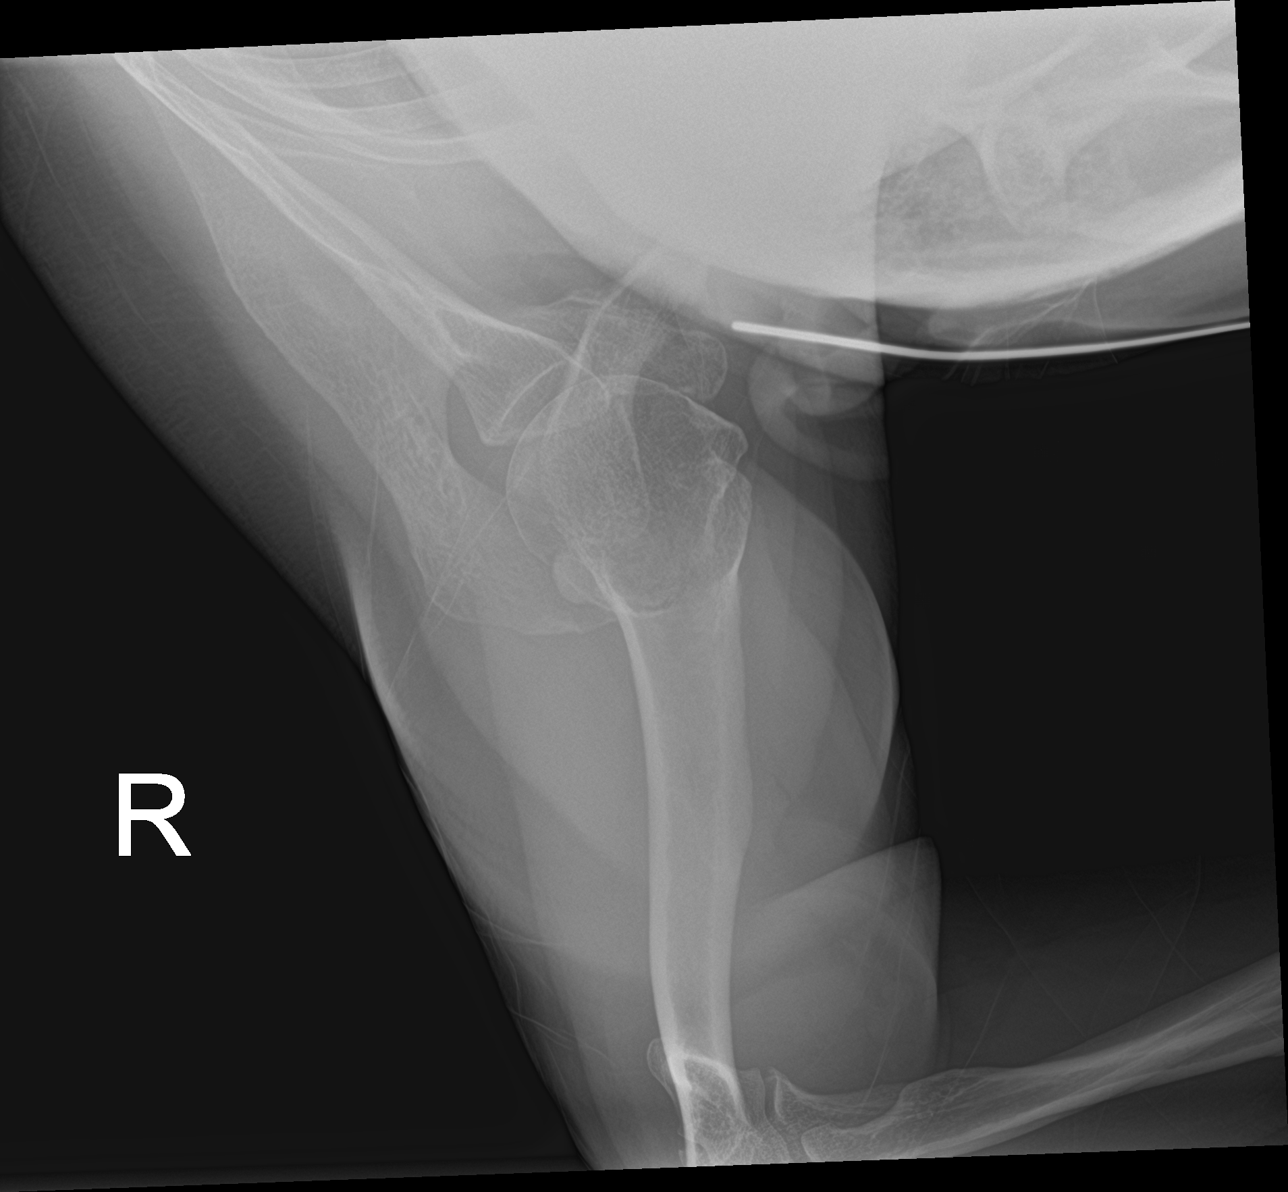

[3 of 3 positions shown; findings below may reference images not displayed]

FINDINGS: There is no evidence of fracture or dislocation. There is no
evidence of arthropathy or other focal bone abnormality. Soft
tissues are unremarkable.
IMPRESSION: Negative.
